# Patient Record
Sex: Female | Born: 1990 | Race: White | Hispanic: No | Marital: Single | Smoking: Former smoker
Health system: Southern US, Community
[De-identification: ages and names within clinical notes are randomized; demographics above are authoritative.]

## PROBLEM LIST (undated history)

## (undated) DIAGNOSIS — G1111 Friedreich ataxia: Secondary | ICD-10-CM

---
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
## 2017-04-13 ENCOUNTER — Other Ambulatory Visit: Payer: Self-pay | Admitting: Cardiology

## 2017-04-13 ENCOUNTER — Encounter: Payer: Self-pay | Admitting: Cardiology

## 2017-04-13 DIAGNOSIS — I43 Cardiomyopathy in diseases classified elsewhere: Secondary | ICD-10-CM

## 2017-04-13 DIAGNOSIS — G1111 Friedreich ataxia: Secondary | ICD-10-CM | POA: Insufficient documentation

## 2017-04-13 DIAGNOSIS — G111 Early-onset cerebellar ataxia: Secondary | ICD-10-CM

## 2017-04-13 NOTE — Progress Notes (Addendum)
   Robin Glenn, Robin Glenn  Date of visit:  04/06/2017 DOB:  1991/03/11    Age:  25 yrs. Medical record number:  [id number]     Account number:  [id number] Primary Care Provider: [facility]  CURRENT DIAGNOSES  1. Dyspnea  2. Early-onset cerebellar ataxia  MEDICATIONS  1. Reclipsen (28) 0.15 mg-0.03 mg tablet, 1 p.o. daily  HISTORY OF PRESENT ILLNESS Patient is seen following an echocardiogram. Her echo shows marked LVH with wall thickness of 18-19 mm. This has been a progression since she was here. I spoke to the patient and her father and told them that I would speak to a physician who dealt with the patient's with [name] and also research things further. After they left I had a chance to do further evaluation and would like her to have a cardiac MRI that we will arrange. At this point with the progression of LVH and EKG changes likely will treat her with beta blocker and also determine if she has any fibrosis in her ventricle. Will await the conversation with the [name] MD. I encouraged her to take the Ibedenone.   PAST HISTORY  Past Medical Illnesses:  Friedreichs Ataxia, scoliosis;  Cardiovascular Illnesses:  no previous history of cardiac disease.;  Surgical Procedures:  no prior surgical procedures;  Cardiology Procedures-Invasive:  no history of prior cardiac procedures;  Cardiology Procedures-Noninvasive:  echocardiogram September 2013, echocardiogram March 2017;  LVEF of 80% documented via echocardiogram on 04/06/2017,    CARDIO-PULMONARY TEST DATES EKG Date:  04/02/2017;  Echocardiography Date: 04/06/2017;     Darden PalmerW. Spencer Tilley, Jr. MD [facility]

## 2017-04-13 NOTE — Progress Notes (Signed)
Robin Glenn, Robin Glenn  Date of visit:  04/02/2017 DOB:  1990-12-26    Age:  26 yrs. Medical record number:  [id number]     Account number:  [id number] Primary Care Provider: [facility] ____________________________ CURRENT DIAGNOSES  1. Dyspnea  2. Early-onset cerebellar ataxia ____________________________ ALLERGIES  NKDA ____________________________ MEDICATIONS  1. Reclipsen (28) 0.15 mg-0.03 mg tablet, 1 p.o. daily ____________________________ CHIEF COMPLAINTS  Followup of Early-onset cerebellar ataxia ____________________________ HISTORY OF PRESENT ILLNESS Patient returns for cardiac followup. She is no longer taking any medications for her Freidreich's ataxia. She works out with a Systems analystpersonal trainer and is thinking about getting a master's degree. She is no longer involved in any of the drug studies that she had previously been involved in. She denies angina and has no PND, orthopnea or edema. She is in a wheelchair and has significant ataxia. Her EKG today shows fairly diffuse T-wave inversions in the fall since her previous EKG of 5 years ago. She has had some insurance changes and did not want to get an echo originally prior to checking about the insurance coverage. ____________________________ PAST HISTORY  Past Medical Illnesses:  Friedreichs Ataxia, scoliosis;  Cardiovascular Illnesses:  no previous history of cardiac disease.;  Surgical Procedures:  no prior surgical procedures;  Cardiology Procedures-Invasive:  no history of prior cardiac procedures;  Cardiology Procedures-Noninvasive:  echocardiogram September 2013, echocardiogram March 2017;  LVEF of 80% documented via echocardiogram on 01/14/2016,   ____________________________ CARDIO-PULMONARY TEST DATES EKG Date:  04/02/2017;  Echocardiography Date: 01/14/2016;   ____________________________ FAMILY HISTORY Father -- Father alive and well Mother -- Mother alive and well ____________________________ SOCIAL  HISTORY Alcohol Use:  no alcohol use;  Smoking:  never smoked;  Diet:  regular diet;  Lifestyle:  single;  Exercise:  works with a Systems analystpersonal trainer 3 days a week;  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 Eyes: wears eye glasses/contact lenses Respiratory: denies dyspnea, cough, wheezing or hemoptysis. Cardiovascular:  please review HPI Abdominal: denies dyspepsia, GI bleeding, constipation, or diarrhea Musculoskeletal:  denies arthritis, venous insufficiency, or muscle weakness. Neurological:  see history of present illness  ____________________________ PHYSICAL EXAMINATION VITAL SIGNS  Blood Pressure:  134/80 Sitting, Right arm, regular cuff   Pulse:  76/min. Weight:  140.00 lbs. Height:  67.00"BMI: 22  Constitutional:  pleasant white female, in no acute distress in wheelchair Skin:  warm and dry to touch, no apparent skin lesions, or masses noted. Head:  normocephalic, normal hair pattern, no masses or tenderness Neck:  supple, without massess. No JVD, thyromegaly or carotid bruits. Carotid upstroke normal. Chest:  normal symmetry, clear to auscultation. Cardiac:  regular rhythm, normal S1 and S2, No S3 or S4, no murmurs, gallops or rubs detected. Extremities & Back:  no deformities, clubbing, cyanosis, erythema or edema observed. Normal muscle strength and tone. Neurological: ataxia of limbs, mild dysrthria ____________________________ IMPRESSIONS/PLAN 1. [name] 2. Evolution of a significantly abnormal EKG with diffuse T-wave inversions 3. 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 I would like for her to have a repeat echo since ECHOs and KGs have previously been done through the drug studies. We discussed getting back to see a  specialist with [name] and considering some sort of treatment including idebenone.  ____________________________ TODAYS ORDERS  1. 2D, color flow, doppler:  At Patient Convenience  2. 2D, color flow, doppler: 1 year  3. 12 Lead EKG: Today  4. 12 Lead EKG: 1 year  5. Return Visit: 1 year                       ____________________________ Cardiology Physician:  Darden Palmer MD [facility]

## 2017-05-02 ENCOUNTER — Ambulatory Visit (HOSPITAL_COMMUNITY)
Admission: RE | Admit: 2017-05-02 | Discharge: 2017-05-02 | Disposition: A | Discharge status: Home / Self Care | Payer: BLUE CROSS/BLUE SHIELD | Source: Ambulatory Visit | Attending: Cardiology | Admitting: Cardiology

## 2017-05-14 ENCOUNTER — Ambulatory Visit (HOSPITAL_COMMUNITY)
Admission: RE | Admit: 2017-05-14 | Discharge: 2017-05-14 | Disposition: A | Discharge status: Home / Self Care | Payer: BLUE CROSS/BLUE SHIELD | Source: Ambulatory Visit | Attending: Cardiology | Admitting: Cardiology

## 2017-05-14 DIAGNOSIS — I517 Cardiomegaly: Secondary | ICD-10-CM | POA: Diagnosis not present

## 2017-05-14 DIAGNOSIS — G111 Early-onset cerebellar ataxia: Secondary | ICD-10-CM | POA: Diagnosis not present

## 2017-05-14 DIAGNOSIS — I43 Cardiomyopathy in diseases classified elsewhere: Secondary | ICD-10-CM | POA: Insufficient documentation

## 2017-05-14 DIAGNOSIS — G1111 Friedreich ataxia: Secondary | ICD-10-CM

## 2017-05-14 LAB — CREATININE, SERUM
Creatinine, Ser: 0.77 mg/dL (ref 0.44–1.00)
GFR calc Af Amer: >60 mL/min (ref >60)
GFR calc non Af Amer: >60 mL/min (ref >60)

## 2017-05-14 MED ORDER — GADOBENATE DIMEGLUMINE 529 MG/ML IV SOLN
23.0000 mL | Freq: Once | INTRAVENOUS | Status: AC | PRN
  Administered 2017-05-14: 23 mL via INTRAVENOUS

## 2018-09-02 DIAGNOSIS — I422 Other hypertrophic cardiomyopathy: Secondary | ICD-10-CM | POA: Insufficient documentation

## 2020-12-14 ENCOUNTER — Emergency Department (HOSPITAL_COMMUNITY)
Admission: EM | Admit: 2020-12-14 | Discharge: 2020-12-14 | Disposition: A | Discharge status: Home / Self Care | Payer: Medicare HMO | Attending: Emergency Medicine | Admitting: Emergency Medicine

## 2020-12-14 ENCOUNTER — Encounter (HOSPITAL_COMMUNITY): Payer: Self-pay

## 2020-12-14 ENCOUNTER — Emergency Department (HOSPITAL_COMMUNITY): Payer: Medicare HMO

## 2020-12-14 ENCOUNTER — Other Ambulatory Visit: Payer: Self-pay

## 2020-12-14 DIAGNOSIS — S99921A Unspecified injury of right foot, initial encounter: Secondary | ICD-10-CM | POA: Diagnosis present

## 2020-12-14 DIAGNOSIS — Y92002 Bathroom of unspecified non-institutional (private) residence single-family (private) house as the place of occurrence of the external cause: Secondary | ICD-10-CM | POA: Insufficient documentation

## 2020-12-14 DIAGNOSIS — S93124A Dislocation of metatarsophalangeal joint of right lesser toe(s), initial encounter: Secondary | ICD-10-CM | POA: Insufficient documentation

## 2020-12-14 DIAGNOSIS — S93104A Unspecified dislocation of right toe(s), initial encounter: Secondary | ICD-10-CM

## 2020-12-14 DIAGNOSIS — W050XXA Fall from non-moving wheelchair, initial encounter: Secondary | ICD-10-CM | POA: Diagnosis not present

## 2020-12-14 DIAGNOSIS — Z87891 Personal history of nicotine dependence: Secondary | ICD-10-CM | POA: Diagnosis not present

## 2020-12-14 HISTORY — DX: Friedreich ataxia: G11.11

## 2020-12-14 MED ORDER — LIDOCAINE HCL (PF) 2 % IJ SOLN
5.0000 mL | Freq: Once | INTRAMUSCULAR | Status: AC
  Administered 2020-12-14: 5 mL

## 2020-12-14 MED ORDER — LIDOCAINE HCL (PF) 2 % IJ SOLN
INTRAMUSCULAR | Status: AC
  Filled 2020-12-14: qty 10

## 2020-12-14 MED ORDER — POVIDONE-IODINE 10 % EX SOLN: CUTANEOUS | Status: DC | PRN

## 2020-12-14 NOTE — Discharge Instructions (Addendum)
Your toe has been reduced (put back into its normal position).  Use the buddy tape as discussed for the next 2 weeks to protect this joint as the ligament heals as discussed.  You may use ice and ibuprofen (motrin) if needed for any pain.  Plan a recheck if your symptoms are not resolved over the next 2 weeks with your primary provider.

## 2020-12-14 NOTE — ED Triage Notes (Signed)
Arrived with mother. Reports she was at home when she lost balance transferring from toilet to wheelchair.  Dx with friedreich's ataxia which attributes to her imbalance.  Reports right pinky toe is what hurts the most. Ankle is tender.

## 2020-12-14 NOTE — ED Notes (Signed)
X Ray at bedside at this time.  

## 2020-12-14 NOTE — ED Notes (Signed)
PA to bedside at this time. 

## 2020-12-15 NOTE — ED Provider Notes (Signed)
[facility] EMERGENCY DEPARTMENT Provider Note   CSN: [id number] Arrival date & time: 12/14/20  5284     History Chief Complaint  Patient presents with  . Foot Injury    Right     Robin Glenn is a 30 y.o. female, wheelchair bound but self transfers due to [name], feel this am transferring from toilet to wheelchair and hit her right 5th toe and lateral foot during the fall.  Has mild soreness along the lateral foot and ankle, but significant pain and deformity of the 5th toe.  She denies any other injury with this fall.  She has had no treatment prior to arrival.   HPI     Past Medical History:  Diagnosis Date  . [name] [facility])     Patient Active Problem List   Diagnosis Date Noted  . Friedreichs ataxia (HCC) 04/13/2017  . 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 No pertinent surgical history.   OB History    Gravida  0   Para  0   Term  0   Preterm  0   AB  0   Living  0     SAB  0   IAB  0   Ectopic  0   Multiple  0   Live Births  0           History reviewed. No pertinent family history.  Social History   Tobacco Use  . Smoking status: Former Smoker    Types: Cigarettes    Quit date: 12/06/2020    Years since quitting: 0.0  . Smokeless tobacco: Never Used  Vaping Use  . Vaping Use: Never used  Substance Use Topics  . Alcohol use: Not Currently  . Drug use: Yes    Frequency: 7.0 times per week    Types: Marijuana    Comment: daily use     Home Medications Prior to Admission medications   Not on File    Allergies    Patient has no allergy information on record.  Review of Systems   Review of Systems  Constitutional: Negative.   Musculoskeletal: Positive for arthralgias. Negative for joint swelling and myalgias.  Neurological: Negative for numbness and headaches.  All other systems reviewed and are negative.   Physical Exam Updated Vital  Signs BP 134/70   Pulse 80   Temp 98.2 F (36.8 C) (Oral)   Resp 17   Ht 5\' 8"  (1.727 m)   Wt 61.2 kg   LMP 11/18/2020 (Within Days)   SpO2 98%   BMI 20.53 kg/m   Physical Exam Vitals reviewed.  Constitutional:      Appearance: She is well-developed and well-nourished.  HENT:     Head: Atraumatic.  Cardiovascular:     Pulses:          Radial pulses are 2+ on the right side and 2+ on the left side.     Comments: Pulses equal bilaterally Musculoskeletal:        General: Tenderness present.     Cervical back: Normal range of motion.     Right foot: Normal capillary refill. Deformity and bony tenderness present. No laceration. Normal pulse.     Comments: Right 5th toe deformity. Distal sensation baseline for pt.   Skin:    General: Skin is warm and dry.  Neurological:     Mental Status: She is alert.     Sensory: No sensory deficit.  Deep Tendon Reflexes: Strength normal. Reflexes normal.  Psychiatric:        Mood and Affect: Mood and affect normal.     ED Results / Procedures / Treatments   Labs (all labs ordered are listed, but only abnormal results are displayed) Labs Reviewed - No data to display  EKG None  Radiology DG Foot 2 Views Right  Result Date: 12/14/2020 CLINICAL DATA:  Fall, right foot pain EXAM: RIGHT FOOT - 2 VIEW COMPARISON:  None. FINDINGS: Two view radiograph right foot demonstrates superolateral dislocation of the right fifth metatarsophalangeal joint. No associated fracture identified. Remaining joint spaces are preserved. Mild soft tissue swelling adjacent to the site of dislocation. IMPRESSION: Right fifth MTP superolateral dislocation. Electronically Signed   By: Helyn Numbers MD   On: 12/14/2020 10:58   DG Toe 5th Right  Result Date: 12/14/2020 CLINICAL DATA:  Post reduction EXAM: RIGHT FIFTH TOE COMPARISON:  12/14/2020 FINDINGS: Reduction of fifth MTP dislocation. Normal alignment and no fracture identified. IMPRESSION: Satisfactory  reduction of the fifth MTP.  No fracture identified. Electronically Signed   By: Marlan Palau M.D.   On: 12/14/2020 12:58    Procedures .Ortho Injury Treatment  Date/Time: 12/14/2020 12:15 PM Performed by: Burgess Amor, PA-C Authorized by: Burgess Amor, PA-C   Consent:    Consent obtained:  Verbal   Consent given by:  Patient   Risks discussed:  Irreducible dislocation and recurrent dislocation   Alternatives discussed:  No treatmentInjury location: toe Location details: right fifth toe Injury type: dislocation Pre-procedure neurovascular assessment: neurovascularly intact Pre-procedure distal perfusion: normal Pre-procedure neurological function comment: baseline for pt, involuntary movements Pre-procedure range of motion: normal Anesthesia: digital block  Anesthesia: Local anesthesia used: yes Local Anesthetic: lidocaine 2% without epinephrine Anesthetic total: 2 mL  Patient sedated: NoManipulation performed: yes Splint type: buddy taping. Splint Applied by: ED Nurse Supplies used: cotton padding and elastic bandage Post-procedure neurovascular assessment: post-procedure neurovascularly intact Post-procedure distal perfusion: normal Comments: Post reduction films obtained and joint reduced.       Medications Ordered in ED Medications  lidocaine HCl (PF) (XYLOCAINE) 2 % injection 5 mL (5 mLs Other Given by Other 12/14/20 1218)    ED Course  I have reviewed the triage vital signs and the nursing notes.  Pertinent labs & imaging results that were available during my care of the patient were reviewed by me and considered in my medical decision making (see chart for details).    MDM Rules/Calculators/A&P                          Right 5th toe dislocation reduction without complication.  Buddy taping, advised to continue taping the toe for the next 2 weeks allowing ligament healing.  Discussed increased risk of repeat dislocation if not supported with splinting. pcp  recheck if sx not completely resolved over the next 2 weeks. Final Clinical Impression(s) / ED Diagnoses Final diagnoses:  Toe dislocation, right, initial encounter    Rx / DC Orders ED Discharge Orders    None       Victoriano Lain 12/15/20 0734    Pollyann Savoy, MD 12/15/20 1524

## 2021-11-07 ENCOUNTER — Emergency Department (HOSPITAL_COMMUNITY)
Admission: EM | Admit: 2021-11-07 | Discharge: 2021-11-07 | Discharge status: Against Medical Advice | Payer: Medicare HMO | Source: Home / Self Care

## 2021-11-07 DIAGNOSIS — J180 Bronchopneumonia, unspecified organism: Secondary | ICD-10-CM | POA: Diagnosis not present

## 2021-11-14 IMAGING — DX DG TOE 5TH 2+V*R*
3 series · 3 of 3 positions shown · non-contrast
Comparison: 12/14/2020

CLINICAL DATA: Post reduction

EXAM:
RIGHT FIFTH TOE

[toe ap]
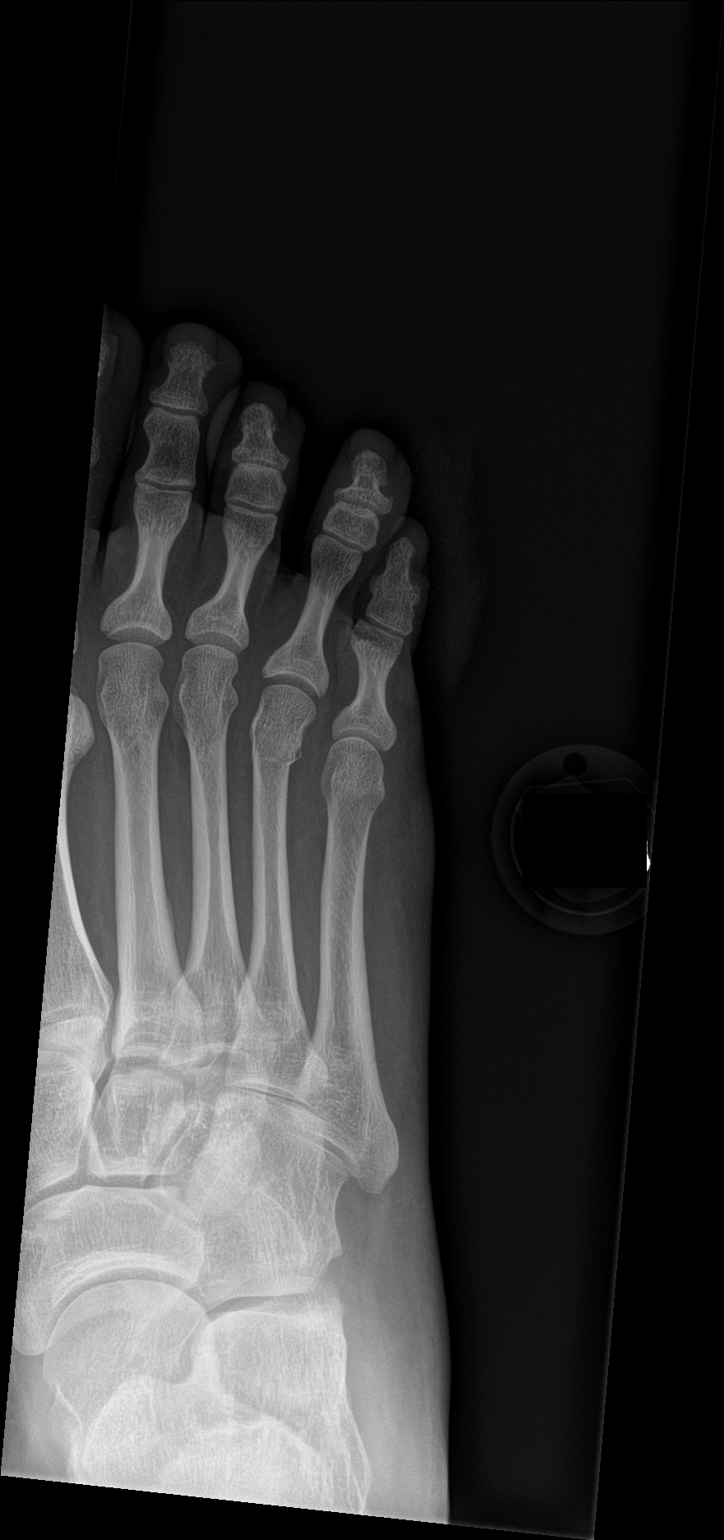

[toe obl]
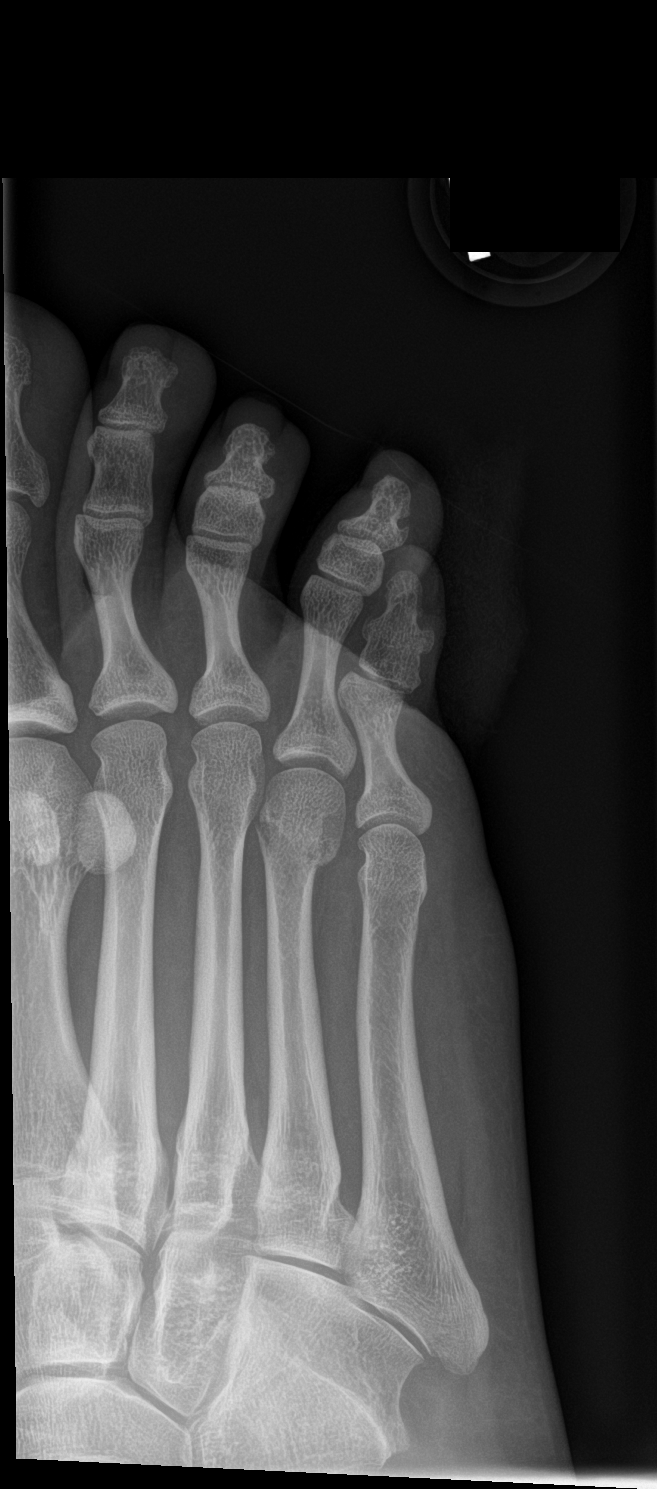

[toe lat]
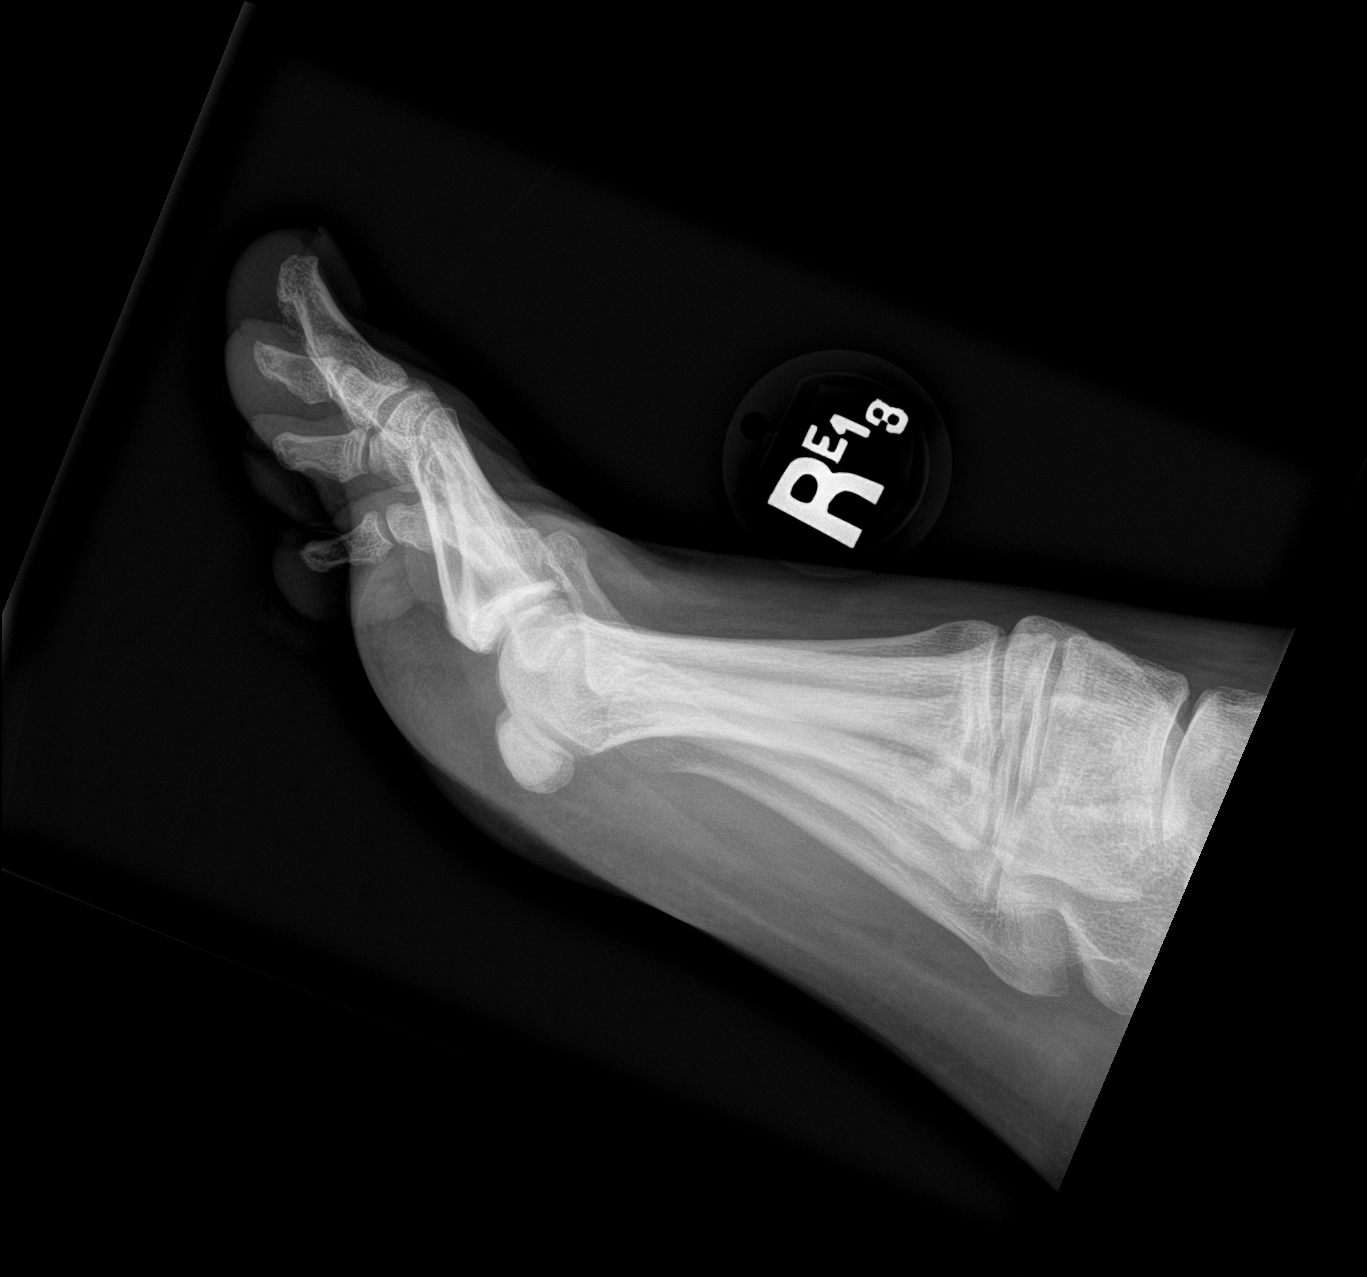

[3 of 3 positions shown; findings below may reference images not displayed]

FINDINGS: Reduction of fifth MTP dislocation. Normal alignment and no fracture
identified.
IMPRESSION: Satisfactory reduction of the fifth MTP.  No fracture identified.

## 2021-12-05 DIAGNOSIS — Z0100 Encounter for examination of eyes and vision without abnormal findings: Secondary | ICD-10-CM | POA: Diagnosis not present

## 2022-02-21 DIAGNOSIS — Z0000 Encounter for general adult medical examination without abnormal findings: Secondary | ICD-10-CM | POA: Diagnosis not present

## 2022-02-21 DIAGNOSIS — Z79899 Other long term (current) drug therapy: Secondary | ICD-10-CM | POA: Diagnosis not present

## 2022-04-24 ENCOUNTER — Telehealth: Payer: Self-pay | Admitting: Neurology

## 2022-04-24 ENCOUNTER — Ambulatory Visit (INDEPENDENT_AMBULATORY_CARE_PROVIDER_SITE_OTHER): Payer: Medicare HMO | Admitting: Neurology

## 2022-04-24 ENCOUNTER — Encounter: Payer: Self-pay | Admitting: Neurology

## 2022-04-24 VITALS — BP 130/79 | HR 74 | Ht 69.0 in

## 2022-04-24 DIAGNOSIS — M419 Scoliosis, unspecified: Secondary | ICD-10-CM | POA: Insufficient documentation

## 2022-04-24 DIAGNOSIS — G1111 Friedreich ataxia: Secondary | ICD-10-CM | POA: Diagnosis not present

## 2022-04-24 NOTE — Progress Notes (Signed)
Chief Complaint  Patient presents with   New Patient (Initial Visit)    Rm 15. Accompanied by mother and father. NP/Paper/[facility]/Kimberly Millsaps NP /Freidreich's ataxia. Discuss new FDA approved medication.      ASSESSMENT AND PLAN  Robin Glenn is a 31 y.o. female  Robin Glenn ataxia  Has been under the care of [place] Dr. Santa Genera, is a candidate for [facility], here to establish care with local neurologist for prescription, paperwork was completed,  She will continue follow-up with Dr. Santa Genera  Laboratory evaluation including required BNP  Went over the medication with patient and her family in detail, including potential side effect, Skycalrys is a enzyme inducer, she is on contraceptives, suggest adding an alternative such as a barrier contraceptive method  Known history of cardiac hypertrophy,  Referral to cardiologist for baseline  Return to clinic in 6 months   DIAGNOSTIC DATA (LABS, IMAGING, TESTING) - I reviewed patient records, labs, notes, testing and imaging myself where available.   MEDICAL HISTORY:  Robin Glenn is a 31 year old female, seen in request by her primary care nurse practitioner [facility] Panda at [facility] for evaluation of Robin Glenn ataxia, she is accompanied by her parents at today's visit April 24, 2022  I reviewed and summarized the referring note. PMHX.  She developed gradual onset of clumsiness, gait abnormality around age 11, progressively worse, by age 32, failed multiple local physicians, including neurologist, family went to [facility], was diagnosed with Robin Glenn ataxia based on genetic testing,  Father brought in note by [facility] physician Dr.  Elise Benne, diagnosis in November 2009, [name], scoliosis, she was referred Robin Glenn ataxia program at [facility] with Dr. Santa Genera is suggested, he has been under Dr. Emilee Hero care since then, was participated in 2  clinical trial from 2012-2013, then 2016-2017, without helping her symptoms,  Her disease continued to progress, increased gait abnormality, she rely on her walker from [age], then went to wheelchair,  She lives at home, work part-time job, is a Chartered loss adjuster, motivation speaker, kept herself busy, denies depression, has boyfriend, on contraceptives, in addition she workout 3 times every week with a Systems analyst since 2013,  Last follow-up with Dr. Santa Genera was before pandemic, MRI cardiac morphology in July 2018 showed normal ejection fraction 62%, marked increase in left ventricular mass, marked fairly asymmetric left ventricular hypertrophy with septum being 22 mm posterior lateral wall 12 mm, moderate left atrial enlargement,  She has significant scoliosis, able to lie flat sleep using 1 pillow, bilateral lower extremity hot sensation, to cool down her leg, denies bowel or bladder incontinence   She has slow worsening dysarthria, mild dysphagia, choking on her food occasionally, needing help cutting her food sometimes, needing help transfer  PHYSICAL EXAM:   Vitals:   04/24/22 0922  BP: 130/79  Pulse: 74  Height: 5\' 9"  (1.753 m)     Body mass index is 19.94 kg/m.  PHYSICAL EXAMNIATION:  Gen: NAD, conversant, well nourised, well groomed                     Cardiovascular: Regular rate rhythm, no peripheral edema, warm, nontender. Eyes: Conjunctivae clear without exudates or hemorrhage Neck: Supple, no carotid bruits. Pulmonary: Clear to auscultation bilaterally  Skeleton: Significant scoliosis  NEUROLOGICAL EXAM:  MENTAL STATUS: Speech/cognition: Awake, alert, oriented to history taking and casual conversation, dysarthria CRANIAL NERVES: CN II: Visual fields are full to confrontation. Pupils are round equal and  briskly reactive to light. CN III, IV, VI: extraocular movement are normal. No ptosis. CN V: Facial sensation is intact to light touch CN VII: Face is  symmetric with normal eye closure  CN VIII: Hearing is normal to causal conversation. CN IX, X: Phonation is normal. CN XI: Head turning and shoulder shrug are intact  MOTOR: Bilateral upper and lower extremity proximal and distal muscle strengths 4 out of 5,  REFLEXES: Areflexia  SENSORY: Intact to light touch, vibratory sensation and pinprick  COORDINATION: No significant truncal ataxia, significant bilateral upper and lower extremity dysmetria left worse than right  GAIT/STANCE: Deferred  REVIEW OF SYSTEMS:  Full 14 system review of systems performed and notable only for as above All other review of systems were negative.   ALLERGIES: No Known Allergies  HOME MEDICATIONS: Current Outpatient Medications  Medication Sig Dispense Refill   TRI-ESTARYLLA 0.18/0.215/0.25 MG-35 MCG tablet Take 1 tablet by mouth daily.     No current facility-administered medications for this visit.    PAST MEDICAL HISTORY: Past Medical History:  Diagnosis Date   [name] (HCC)     PAST SURGICAL HISTORY: History reviewed. No pertinent surgical history.  FAMILY HISTORY: History reviewed. No pertinent family history.  SOCIAL HISTORY: Social History   Socioeconomic History   Marital status: Single    Spouse name: Not on file   Number of children: Not on file   Years of education: Not on file   Highest education level: Not on file  Occupational History   Not on file  Tobacco Use   Smoking status: Former    Types: Cigarettes    Quit date: 12/06/2020    Years since quitting: 1.3   Smokeless tobacco: Never  Vaping Use   Vaping Use: Never used  Substance and Sexual Activity   Alcohol use: Not Currently   Drug use: Yes    Frequency: 7.0 times per week    Types: Marijuana    Comment: daily use    Sexual activity: Not Currently    Birth control/protection: Pill  Other Topics Concern   Not on file  Social History Narrative   Not on file   Social Determinants of  Health   Financial Resource Strain: Not on file  Food Insecurity: Not on file  Transportation Needs: Not on file  Physical Activity: Not on file  Stress: Not on file  Social Connections: Not on file  Intimate Partner Violence: Not on file    Total time spent reviewing the chart, obtaining history, examined patient, ordering tests, documentation, consultations and family, care coordination was 76 minutes.      Levert Feinstein, M.D. Ph.D.  [facility] [address], [address] [place], [place] [address] Ph: [phone] Fax: [phone]  CC:  Marva Panda, NP [address] [place],  [place] [address]  Marva Panda, NP  ,

## 2022-04-24 NOTE — Patient Instructions (Signed)
[email or web address]   Advise patients to take [facility] on an empty stomach at least 1 hour before eating [see Dosage  and Administration (2.2)]. Swallow SKYCLARYS capsules whole. Do not open, crush, or chew. Advise patients that if a dose of SKYCLARYS is missed to not to double their dose or take more than  the prescribed dose [see Dosage and Administration (2.3)].  Advise patients to avoid grapefruit juice and grapefruit while they are taking [facility] [see Drug  Interactions (7.1)].  Hormonal Contraceptives Omaveloxolone is a weak CYP3A4 inducer [see Clinical Pharmacology (12.3)]. Concomitant use with SKYCLARYS may reduce the efficacy of hormonal contraceptives. Advise patients to avoid  concomitant use with combined hormonal contraceptives (e.g., pill, patch, ring), implants, and  progestin only pills [see Use in Specific Populations (8.3)].   8.3 Females and Males of Reproductive Potential  SKYCLARYS may decrease the efficacy of hormonal contraceptives [see Drug Interactions (7.2) and  Clinical Pharmacology (12.3)]. Advise patients to avoid concomitant use with combined hormonal  contraceptives (e.g., pill, patch, ring), implants, and progestin only pills. Counsel females using  hormonal contraceptives to use an alternative contraceptive method (e.g., non-hormonal intrauterine  system) or additional non-hormonal contraceptive (e.g., condoms) during concomitant use and for 28  days after discontinuation of SKYCLARYS.

## 2022-04-24 NOTE — Telephone Encounter (Signed)
Referral for Cardiology sent to [facility] [phone].

## 2022-04-25 ENCOUNTER — Other Ambulatory Visit: Payer: Self-pay | Admitting: *Deleted

## 2022-04-25 ENCOUNTER — Telehealth: Payer: Self-pay | Admitting: *Deleted

## 2022-04-25 LAB — TSH: TSH: 0.879 u[IU]/mL (ref 0.450–4.500)

## 2022-04-25 LAB — BRAIN NATRIURETIC PEPTIDE: BNP: 77.5 pg/mL (ref 0.0–100.0)

## 2022-04-25 MED ORDER — SKYCLARYS 50 MG PO CAPS: 150.0000 mg | ORAL_CAPSULE | Freq: Every day | ORAL | 11 refills | Status: DC

## 2022-04-25 NOTE — Telephone Encounter (Addendum)
Start form faxed to the [facility] program for [facility] 50mg , 3 capsules daily.  Ph: [phone] Fax: [phone]  PA started on covermymeds (key: [id number]). Pharmacy coverage through [employer] [phone]). PA approved through 11/12/2022.  ____________________________________ I called the REACH program and spoke to [facility] who works as a [employer]. States once the company completes a benefits investigation then they will reach out to the family. They will discuss co-pay and shipment.   I also called the patient's mother to provide her with this update. If she does not hear from the company, she was instructed to let Sports coach know so we can assist.

## 2022-04-26 ENCOUNTER — Telehealth: Payer: Self-pay | Admitting: *Deleted

## 2022-04-26 NOTE — Telephone Encounter (Signed)
-----   Message from Levert Feinstein, MD sent at 04/25/2022  5:27 PM EDT ----- Please call patient, laboratory evaluation showed no significant abnormalities.

## 2022-04-26 NOTE — Telephone Encounter (Signed)
Tried mothers phone# [phone] twice. Got busy signal.  Called mother at [phone]. Relayed results per Dr. Rhea Belton note. She verbalized understanding.

## 2022-05-10 ENCOUNTER — Ambulatory Visit (INDEPENDENT_AMBULATORY_CARE_PROVIDER_SITE_OTHER): Payer: Medicare HMO

## 2022-05-10 ENCOUNTER — Ambulatory Visit: Payer: Medicare HMO | Admitting: Podiatry

## 2022-05-10 DIAGNOSIS — R2689 Other abnormalities of gait and mobility: Secondary | ICD-10-CM

## 2022-05-10 DIAGNOSIS — M25372 Other instability, left ankle: Secondary | ICD-10-CM

## 2022-05-10 DIAGNOSIS — G1111 Friedreich ataxia: Secondary | ICD-10-CM

## 2022-05-17 NOTE — Progress Notes (Signed)
  Subjective:  Patient ID: Robin Glenn, female    DOB: [birth year],  MRN: [id number]  Chief Complaint  Patient presents with   Foot Pain    31 y.o. female presents with the above complaint.  Patient presents with complaint of left ankle instability with some pain and swelling.  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  She states that she has been diagnosed with this for a long time and slowly has decompensated with needing for wheelchair with some assisted walking.  Patient states that she twisted her ankle about 2 to 3 weeks ago and there is some swelling and pain associated with it mildly.  She wanted get it evaluated she wants to know why her left ankle is turned inwards and not the right ankle.  She states is making it very difficult to ambulate even limited.  She does not wear any kind of bracing she has not seen anyone else prior to seeing me.  She wanted to get it evaluated.   Review of Systems: Negative except as noted in the HPI. Denies N/V/F/Ch.  Past Medical History:  Diagnosis Date   [name] (HCC)     Current Outpatient Medications:    Omaveloxolone (SKYCLARYS) 50 MG CAPS, Take 150 mg by mouth daily., Disp: 90 capsule, Rfl: 11   TRI-ESTARYLLA 0.18/0.215/0.25 MG-35 MCG tablet, Take 1 tablet by mouth daily., Disp: , Rfl:   Social History   Tobacco Use  Smoking Status Former   Types: Cigarettes   Quit date: 12/06/2020   Years since quitting: 1.4  Smokeless Tobacco Never    No Known Allergies Objective:  There were no vitals filed for this visit. There is no height or weight on file to calculate BMI. Constitutional Well developed. Well nourished.  Vascular Dorsalis pedis pulses palpable bilaterally. Posterior tibial pulses palpable bilaterally. Capillary refill normal to all digits.  No cyanosis or clubbing noted. Pedal hair growth normal.  Neurologic Normal speech. Oriented to person, place, and time. Epicritic  sensation to light touch grossly present bilaterally.  Dermatologic Nails well groomed and normal in appearance. No open wounds. No skin lesions.  Orthopedic: Gait examination shows with limitation weakening of the peroneal tendon with inversion and plantarflexion of the foot.  Patient has consistent finding with weakness of the anterior muscle as well consistent with dropfoot.   Radiographs: 3 views of skeletally mature adult left ankle: Limitations noted due to patient is unable to take proper views however no signs of fractures noted.  No ankle fracture noted.  No bony abnormalities identified Assessment:   1. Friedreichs ataxia (HCC)   2. Antalgic gait   3. Ankle instability, left    Plan:  Patient was evaluated and treated and all questions answered.  Left ankle instability with antalgic gait with underlying history of Fredericks ataxia -All questions and concerns were discussed with the patient in extensive detail I ultimately discussed with her that she will benefit from bracing while ambulation.  She does states that Tri-Lock ankle brace does give her some stability while ambulating.  I believe she needs more of a permanent bracing for this.  I will place her in a cam boot to give her more stability to allow her to ambulate.  This will also help with the swelling and pain from the ankle sprain that she may have experienced. -I have given her prescription for [employer] for possible dropfoot brace/AFO bracing -Cam boot was dispensed  No follow-ups on file.

## 2022-05-17 NOTE — Telephone Encounter (Addendum)
Received [employer] application for the patient assistance program. Completed provider sections and faxed back to company. This was sent along with her clinical notes, demographics, copy of insurance cards and prescription form signed by MD.  [employer] Program:  Ph: [phone] Fax: [phone]

## 2022-05-22 NOTE — Telephone Encounter (Signed)
[employer]) have not received the Patient Assistant Application. Request to refax the form.  Would like a call from the nurse Contact info: [phone]

## 2022-05-22 NOTE — Telephone Encounter (Signed)
We received a confirmation of receipt on 05/17/22. I have re-faxed the patient assistance application again today. I also called [employer] back so she can be watching out for it. She will call back, if anything further is needed.

## 2022-05-26 ENCOUNTER — Ambulatory Visit (HOSPITAL_BASED_OUTPATIENT_CLINIC_OR_DEPARTMENT_OTHER): Payer: Medicare HMO | Admitting: Cardiology

## 2022-05-26 ENCOUNTER — Encounter (HOSPITAL_BASED_OUTPATIENT_CLINIC_OR_DEPARTMENT_OTHER): Payer: Self-pay | Admitting: Cardiology

## 2022-05-26 VITALS — BP 125/75 | HR 82 | Wt 150.0 lb

## 2022-05-26 DIAGNOSIS — G1111 Friedreich ataxia: Secondary | ICD-10-CM

## 2022-05-26 DIAGNOSIS — Z8249 Family history of ischemic heart disease and other diseases of the circulatory system: Secondary | ICD-10-CM

## 2022-05-26 DIAGNOSIS — R9431 Abnormal electrocardiogram [ECG] [EKG]: Secondary | ICD-10-CM

## 2022-05-26 DIAGNOSIS — I43 Cardiomyopathy in diseases classified elsewhere: Secondary | ICD-10-CM

## 2022-05-26 NOTE — Patient Instructions (Addendum)
Medication Instructions:  °Your Physician recommend you continue on your current medication as directed.   ° °*If you need a refill on your cardiac medications before your next appointment, please call your pharmacy* ° ° °Lab Work: °None ordered today ° ° °Testing/Procedures: °Your physician has requested that you have an echocardiogram. Echocardiography is a painless test that uses sound waves to create images of your heart. It provides your doctor with information about the size and shape of your heart and how well your heart’s chambers and valves are working. This procedure takes approximately one hour. There are no restrictions for this procedure. °[address] ° ° ° °Follow-Up: °At [facility], you and your health needs are our priority.  As part of our continuing mission to provide you with exceptional heart care, we have created designated Provider Care Teams.  These Care Teams include your primary Cardiologist (physician) and Advanced Practice Providers (APPs -  Physician Assistants and Nurse Practitioners) who all work together to provide you with the care you need, when you need it. ° °We recommend signing up for the patient portal called "MyChart".  Sign up information is provided on this After Visit Summary.  MyChart is used to connect with patients for Virtual Visits (Telemedicine).  Patients are able to view lab/test results, encounter notes, upcoming appointments, etc.  Non-urgent messages can be sent to your provider as well.   °To learn more about what you can do with MyChart, go to [email or web address].   ° °Your next appointment:   °1 year(s) ° °The format for your next appointment:   °In Person ° °Provider:   °Bridgette Christopher, MD  ° ° °

## 2022-05-26 NOTE — Progress Notes (Signed)
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 Previously followed by Dr. Donnie Aho.  Was on interferon-gamma therapy for a year, there is some evidence that this can be related to LVH. Was on this 2016-2017.  About to start omaveloxolone (skyclarys) within the next month.   She has a physical therapist, tries to remain active at the gym. Recently injured her left ankle, in a boot today. Enjoys being outside, being pushed in the wheelchair at the park. Enjoys using the exercise bike as it is table for her ankles. Does a lot of machines with her trainer at the gym, likes the leg machines to keep them strong.  Denies chest pain, shortness of breath at rest or with normal exertion. No PND, orthopnea, significant LE edema or unexpected weight gain. No syncope or palpitations.   Past Medical History:  Diagnosis Date   [name] (HCC)     No past surgical history on file.  Current Medications: Current Outpatient Medications on File Prior to Visit  Medication Sig   Omaveloxolone (SKYCLARYS) 50 MG CAPS Take 150 mg by mouth daily.   TRI-ESTARYLLA 0.18/0.215/0.25 MG-35 MCG tablet Take 1 tablet by mouth daily.   No current facility-administered medications on file prior to visit.     Allergies:   Patient has no known allergies.   Social History   Tobacco Use   Smoking status: Former    Types: Cigarettes    Quit date: 12/06/2020    Years since quitting: 1.4   Smokeless  tobacco: Never  Vaping Use   Vaping Use: Never used  Substance Use Topics   Alcohol use: Not Currently   Drug use: Yes    Frequency: 7.0 times per week    Types: Marijuana    Comment: daily use     Family History: Father is my patient, history of HOCM and CAD.  ROS:   Please see the history of present illness.  Additional pertinent ROS: Constitutional: Negative for chills, fever, night sweats, unintentional weight loss  HENT: Negative for ear pain and hearing loss.   Eyes: Negative for loss of vision and eye pain.  Respiratory: Negative for cough, sputum, wheezing.   Cardiovascular: See HPI. Gastrointestinal: Negative for abdominal pain, melena, and hematochezia.  Genitourinary: Negative for dysuria and hematuria.  Musculoskeletal: Negative for falls and myalgias.  Skin: Negative for itching and rash.  Neurological: Negative for focal weakness, focal sensory changes and loss of consciousness.  Endo/Heme/Allergies: Does not bruise/bleed easily.     EKGs/Labs/Other Studies Reviewed:    The following studies were reviewed today: Cardiac MRI 05/14/2017 FINDINGS: There was moderate LAE. The RA/RV were normal in size and function. There was no ASD/PFO/VSD. There was no pericardial effusion. The mitral aortic and tricuspid valves appeared normal. There was severe fairly asymmetric septal hypertrophy. The septum measured 22 mm with the posterior and lateral walls measuring 12 mm. There was no SAM. There was hyperdynamic LV systolic function with accelerated flow velocity  through the mid myocardium. The quantitative EF was 62% (EDV 92 cc ESV 35 cc SV 57 cc). LV mass was markedly elevated at 191 grams and 109 grams/m2. Delayed enhancement images with gadolinium showed no significant uptake/scar. (There was one very small area near the basal septum that I felt was partial volume averaging in the RV cavity).   IMPRESSION: 1) Normal LV function quantitative EF 62%   2) Marked  increase in LV mass 191 grams / 109 grams/m2   3) Marked fairly asymmetric LV hypertrophy with septum being 22 mm and posterior ans lateral walls 12 mm   4) No delayed LGE uptake on inversion recovery sequences   5) Moderate LAE  EKG:  EKG is personally reviewed.   05/26/22: NSR at 82 bpm, LAE, LVH with repol  Recent Labs: 04/24/2022: BNP 77.5; TSH 0.879  Recent Lipid Panel No results found for: "CHOL", "TRIG", "HDL", "CHOLHDL", "VLDL", "LDLCALC", "LDLDIRECT"  Physical Exam:    VS:  BP 125/75   Pulse 82   Wt 150 lb (68 kg)   SpO2 99%   BMI 22.15 kg/m     Wt Readings from Last 3 Encounters:  05/26/22 150 lb (68 kg)  12/14/20 135 lb (61.2 kg)    GEN: Well nourished, well developed in no acute distress HEENT: Normal, moist mucous membranes NECK: No JVD CARDIAC: regular rhythm, normal S1 and S2, no rubs or gallops. No murmur. VASCULAR: Radial and DP pulses 2+ bilaterally. No carotid bruits RESPIRATORY:  Clear to auscultation without rales, wheezing or rhonchi  ABDOMEN: Soft, non-tender, non-distended MUSCULOSKELETAL:  in wheelchair today, moves all 4 limbs independently SKIN: Warm and dry, no edema NEUROLOGIC:  Alert and oriented x 3. No focal neuro deficits noted. PSYCHIATRIC:  Normal affect    ASSESSMENT:    1. 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 Friedreichs ataxia (HCC)   3. Abnormal ECG   4. 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 UPDATED TO ADD: moderate to severe concentric LVH (walls 1.7 cm), EF normal but stroke volume 57 ml. Chordal SAM and mid cavitary gradient 17 mmHg -wants to work on continued exercise/strengthening. Interested in [facility], referred today  Family history of CAD, HOCM -father is also my patient -Friedreichs ataxia associated with more of a septal asymmetric hypertrophy. She has concentric LVH,  more consistent with her family history of HCM, but cannot exclude involvement from FA -no documented arrhythmias, no syncope  Cardiac risk counseling and prevention recommendations: -recommend heart healthy/Mediterranean diet, with whole grains, fruits, vegetable, fish, lean meats, nuts, and olive oil. Limit salt. -recommend moderate walking, 3-5 times/week for 30-50 minutes each session. Aim for at least 150 minutes.week. Goal should be pace of 3 miles/hours, or walking 1.5 miles in 30 minutes -recommend avoidance of tobacco products. Avoid excess alcohol. -ASCVD risk score: The ASCVD Risk score (Arnett DK, et al., 2019) failed to calculate for the following reasons:   The 2019 ASCVD risk score is only valid for ages 76 to 41    Plan for follow up:  Jodelle Red, MD, PhD, [facility] [place]  [facility]    Medication Adjustments/Labs and Tests Ordered: Current medicines are reviewed at length with the patient today.  Concerns regarding medicines are outlined above.  Orders Placed This Encounter  Procedures   Ambulatory referral to [facility]   EKG 12-Lead  ECHOCARDIOGRAM COMPLETE   No orders of the defined types were placed in this encounter.   Patient Instructions  Medication Instructions:  Your Physician recommend you continue on your current medication as directed.    *If you need a refill on your cardiac medications before your next appointment, please call your pharmacy*   Lab Work: None ordered today   Testing/Procedures: Your physician has requested that you have an echocardiogram. Echocardiography is a painless test that uses sound waves to create images of your heart. It provides your doctor with information about the size and shape of your heart and how well your heart's chambers and valves are working. This procedure takes approximately one hour. There are no restrictions for this procedure. [facility], you and your health needs are our priority.  As part of our continuing mission to provide you with exceptional heart care, we have created designated Provider Care Teams.  These Care Teams include your primary Cardiologist (physician) and Advanced Practice Providers (APPs -  Physician Assistants and Nurse Practitioners) who all work together to provide you with the care you need, when you need it.  We recommend signing up for the patient portal called "MyChart".  Sign up information is provided on this After Visit Summary.  MyChart is used to connect with patients for Virtual Visits (Telemedicine).  Patients are able to view lab/test results, encounter notes, upcoming appointments, etc.  Non-urgent messages can be sent to your provider as well.   To learn more about what you can do with MyChart, go to [email or web address].    Your next appointment:   1 year(s)  The format for your next appointment:   In Person  Provider:   Buford Dresser, MD{         Signed, Buford Dresser, MD PhD 05/26/2022     [place]

## 2022-06-01 NOTE — Telephone Encounter (Signed)
Shayleen from [employer] has called re: them not received the Pt Assistance Program form ph# to call is the [phone] fax#[phone] attention [employer]

## 2022-06-01 NOTE — Telephone Encounter (Signed)
I spoke to [facility]. She needs two additional pages for patient assistance. Completed, signed and faxed back.

## 2022-06-07 NOTE — Telephone Encounter (Signed)
Received patient assistance program approval notice for [employer]. Pt ID w/ PAP: [id number].  The patient's prescription will be filled through the [employer] program [phone]). They use the following pharmacy:  [facility] Ph: [phone] Fax: [phone]

## 2022-06-09 DIAGNOSIS — G119 Hereditary ataxia, unspecified: Secondary | ICD-10-CM | POA: Diagnosis not present

## 2022-06-09 DIAGNOSIS — Z309 Encounter for contraceptive management, unspecified: Secondary | ICD-10-CM | POA: Diagnosis not present

## 2022-06-09 DIAGNOSIS — I422 Other hypertrophic cardiomyopathy: Secondary | ICD-10-CM | POA: Diagnosis not present

## 2022-06-12 ENCOUNTER — Ambulatory Visit (INDEPENDENT_AMBULATORY_CARE_PROVIDER_SITE_OTHER): Payer: Medicare HMO

## 2022-06-12 DIAGNOSIS — R9431 Abnormal electrocardiogram [ECG] [EKG]: Secondary | ICD-10-CM | POA: Diagnosis not present

## 2022-06-12 DIAGNOSIS — G1111 Friedreich ataxia: Secondary | ICD-10-CM

## 2022-06-12 DIAGNOSIS — I43 Cardiomyopathy in diseases classified elsewhere: Secondary | ICD-10-CM | POA: Diagnosis not present

## 2022-06-12 LAB — ECHOCARDIOGRAM COMPLETE
AR max vel: 2.36 cm2
AV Area VTI: 2.26 cm2
AV Area mean vel: 2.21 cm2
AV Mean grad: 4 mmHg
AV Peak grad: 6.9 mmHg
Ao pk vel: 1.31 m/s
Area-P 1/2: 5.66 cm2
S' Lateral: 1.83 cm

## 2022-06-19 DIAGNOSIS — M25372 Other instability, left ankle: Secondary | ICD-10-CM | POA: Diagnosis not present

## 2022-06-21 ENCOUNTER — Ambulatory Visit: Payer: Medicare HMO | Admitting: Podiatry

## 2022-06-21 DIAGNOSIS — R2689 Other abnormalities of gait and mobility: Secondary | ICD-10-CM | POA: Diagnosis not present

## 2022-06-21 DIAGNOSIS — M25372 Other instability, left ankle: Secondary | ICD-10-CM | POA: Diagnosis not present

## 2022-06-21 DIAGNOSIS — G1111 Friedreich ataxia: Secondary | ICD-10-CM

## 2022-06-21 NOTE — Progress Notes (Signed)
  Subjective:  Patient ID: Robin Glenn, female    DOB: Jul 15, 1991,  MRN: [id number]  Chief Complaint  Patient presents with   Foot Pain    31 y.o. female presents with the above complaint.  Patient presents with follow-up to left ankle instability with a history of Fredericks ataxia.  She states the bracing helped considerably.  The ankle brace has also helped.  She denies any other acute complaints.   Review of Systems: Negative except as noted in the HPI. Denies N/V/F/Ch.  Past Medical History:  Diagnosis Date   [name] (HCC)     Current Outpatient Medications:    Omaveloxolone (SKYCLARYS) 50 MG CAPS, Take 150 mg by mouth daily., Disp: 90 capsule, Rfl: 11   TRI-ESTARYLLA 0.18/0.215/0.25 MG-35 MCG tablet, Take 1 tablet by mouth daily., Disp: , Rfl:   Social History   Tobacco Use  Smoking Status Former   Types: Cigarettes   Quit date: 12/06/2020   Years since quitting: 1.5  Smokeless Tobacco Never    No Known Allergies Objective:  There were no vitals filed for this visit. There is no height or weight on file to calculate BMI. Constitutional Well developed. Well nourished.  Vascular Dorsalis pedis pulses palpable bilaterally. Posterior tibial pulses palpable bilaterally. Capillary refill normal to all digits.  No cyanosis or clubbing noted. Pedal hair growth normal.  Neurologic Normal speech. Oriented to person, place, and time. Epicritic sensation to light touch grossly present bilaterally.  Dermatologic Nails well groomed and normal in appearance. No open wounds. No skin lesions.  Orthopedic: Gait examination shows with limitation weakening of the peroneal tendon with inversion and plantarflexion of the foot.  Patient has consistent finding with weakness of the anterior muscle as well consistent with dropfoot.   Radiographs: 3 views of skeletally mature adult left ankle: Limitations noted due to patient is unable to take proper views however no signs of  fractures noted.  No ankle fracture noted.  No bony abnormalities identified Assessment:   1. Friedreichs ataxia (HCC)   2. Antalgic gait   3. Ankle instability, left     Plan:  Patient was evaluated and treated and all questions answered.  Left ankle instability with antalgic gait with underlying history of Fredericks ataxia -All questions and concerns were discussed with the patient in extensive detail  -Plan clear instability has resolved with bracing.  At this time she may have to wear brace for the rest of her life.  She states understanding. -The brace is not Tri-Lock ankle braces given her stability while trying to get up.  At this time I discussed with her that if it worsens with the brace starts wearing off come back and see me.  She states understanding. -Disc to do cam boot.  No follow-ups on file.

## 2022-08-25 ENCOUNTER — Encounter: Payer: Self-pay | Admitting: Neurology

## 2022-08-28 ENCOUNTER — Other Ambulatory Visit: Payer: Self-pay | Admitting: Neurology

## 2022-08-28 DIAGNOSIS — G1111 Friedreich ataxia: Secondary | ICD-10-CM

## 2022-08-30 ENCOUNTER — Other Ambulatory Visit (INDEPENDENT_AMBULATORY_CARE_PROVIDER_SITE_OTHER): Payer: Self-pay

## 2022-08-30 DIAGNOSIS — Z0289 Encounter for other administrative examinations: Secondary | ICD-10-CM

## 2022-08-30 DIAGNOSIS — G1111 Friedreich ataxia: Secondary | ICD-10-CM | POA: Diagnosis not present

## 2022-08-31 LAB — CBC WITH DIFFERENTIAL/PLATELET
Basophils Absolute: 0.1 10*3/uL (ref 0.0–0.2)
Basos: 1 %
EOS (ABSOLUTE): 0.1 10*3/uL (ref 0.0–0.4)
Eos: 1 %
Hematocrit: 40 % (ref 34.0–46.6)
Hemoglobin: 13 g/dL (ref 11.1–15.9)
Immature Grans (Abs): 0.1 10*3/uL (ref 0.0–0.1)
Immature Granulocytes: 1 %
Lymphocytes Absolute: 1.7 10*3/uL (ref 0.7–3.1)
Lymphs: 25 %
MCH: 30.2 pg (ref 26.6–33.0)
MCHC: 32.5 g/dL (ref 31.5–35.7)
MCV: 93 fL (ref 79–97)
Monocytes Absolute: 0.6 10*3/uL (ref 0.1–0.9)
Monocytes: 9 %
Neutrophils Absolute: 4.4 10*3/uL (ref 1.4–7.0)
Neutrophils: 63 %
Platelets: 241 10*3/uL (ref 150–450)
RBC: 4.31 x10E6/uL (ref 3.77–5.28)
RDW: 12.2 % (ref 11.7–15.4)
WBC: 7 10*3/uL (ref 3.4–10.8)

## 2022-08-31 LAB — COMPREHENSIVE METABOLIC PANEL
ALT: 37 IU/L — ABNORMAL HIGH (ref 0–32)
AST: 29 IU/L (ref 0–40)
Albumin/Globulin Ratio: 1.8 (ref 1.2–2.2)
Albumin: 4.1 g/dL (ref 4.0–5.0)
Alkaline Phosphatase: 36 IU/L — ABNORMAL LOW (ref 44–121)
BUN/Creatinine Ratio: 32 — ABNORMAL HIGH (ref 9–23)
BUN: 19 mg/dL (ref 6–20)
Bilirubin Total: 0.5 mg/dL (ref 0.0–1.2)
CO2: 24 mmol/L (ref 20–29)
Calcium: 9.1 mg/dL (ref 8.7–10.2)
Chloride: 104 mmol/L (ref 96–106)
Creatinine, Ser: 0.59 mg/dL (ref 0.57–1.00)
Globulin, Total: 2.3 g/dL (ref 1.5–4.5)
Glucose: 81 mg/dL (ref 70–99)
Potassium: 4.4 mmol/L (ref 3.5–5.2)
Sodium: 142 mmol/L (ref 134–144)
Total Protein: 6.4 g/dL (ref 6.0–8.5)
eGFR: 124 mL/min/{1.73_m2} (ref >59)

## 2022-09-14 ENCOUNTER — Encounter: Payer: Self-pay | Admitting: Neurology

## 2022-09-14 ENCOUNTER — Encounter: Payer: Self-pay | Admitting: Podiatry

## 2022-09-14 NOTE — Telephone Encounter (Signed)
Please advise 

## 2022-09-19 ENCOUNTER — Other Ambulatory Visit: Payer: Self-pay | Admitting: Podiatry

## 2022-09-19 DIAGNOSIS — M25372 Other instability, left ankle: Secondary | ICD-10-CM

## 2022-09-21 ENCOUNTER — Encounter: Payer: Self-pay | Admitting: Neurology

## 2022-09-21 ENCOUNTER — Ambulatory Visit: Payer: Medicare HMO | Admitting: Neurology

## 2022-09-21 VITALS — BP 118/76 | HR 80 | Ht 69.0 in

## 2022-09-21 DIAGNOSIS — G1111 Friedreich ataxia: Secondary | ICD-10-CM | POA: Diagnosis not present

## 2022-09-21 DIAGNOSIS — G8929 Other chronic pain: Secondary | ICD-10-CM

## 2022-09-21 DIAGNOSIS — M25572 Pain in left ankle and joints of left foot: Secondary | ICD-10-CM | POA: Diagnosis not present

## 2022-09-21 NOTE — Progress Notes (Signed)
Chief Complaint  Patient presents with   Follow-up    Rm 14.  Accompanied by mom and dad. C/o previous left ankle injury. Has left foot inversion. Discuss botox for left calve. C/o balance difficulties.      ASSESSMENT AND PLAN  Robin Glenn is a 31 y.o. female  Robin Glenn ataxia  Has been under the care of [place] specialist Dr. Santa Glenn, started [facility] since August 2023, tolerating it well  She will continue follow-up with Dr. Santa Glenn  Laboratory evaluation including BNP today   Known history of cardiac hypertrophy,  Echocardiogram showed hypertrophic cardiomyopathy, normal ejection fraction, but significant concentric hypertrophy Worsening left ankle pain,  The tendency for left ankle plantarflexion are most consistent with musculoskeletal etiology, suggested warm compression, massage, not a good candidate for botulism toxin injection  Highly recommend water aerobic  Return To Clinic With NP In 6 Months  DIAGNOSTIC DATA (LABS, IMAGING, TESTING) - I reviewed patient records, labs, notes, testing and imaging myself where available.   MEDICAL HISTORY:  Robin Glenn is a 31 year old female, seen in request by her primary care nurse practitioner [facility] at [facility] for evaluation of Robin Glenn ataxia, she is accompanied by her parents at today's visit April 24, 2022  I reviewed and summarized the referring note. PMHX.  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  Elise Glenn, diagnosis in November 2009, [name], scoliosis, she was referred Robin Glenn ataxia program at [facility] with Dr. Santa Glenn is suggested, he has been under Dr. Emilee Glenn care since  then, was participated in 2 clinical trial from 2012-2013, then 2016-2017, without helping her symptoms,  Her disease continued to progress, increased gait abnormality, she rely on her walker from [age], then went to wheelchair,  She lives at home, work part-time job, is a Chartered loss adjuster, motivation speaker, kept herself busy, denies depression, has boyfriend, on contraceptives, in addition she workout 3 times every week with a Systems analyst since 2013,  Last follow-up with Dr. Santa Glenn was before pandemic, MRI cardiac morphology in July 2018 showed normal ejection fraction 62%, marked increase in left ventricular mass, marked fairly asymmetric left ventricular hypertrophy with septum being 22 mm posterior lateral wall 12 mm, moderate left atrial enlargement,  She has significant scoliosis, able to lie flat sleep using 1 pillow, bilateral lower extremity hot sensation, to cool down her leg, denies bowel or bladder incontinence   She has slow worsening dysarthria, mild dysphagia, choking on her food occasionally, needing help cutting her food sometimes, needing help transfer   Update September 21, 2022: She start Skycaryls 150mg  daily since end of July 2023, tolerating it well,  Had cardiology evaluation, normal CMP, CBC, TSH, ALT, BMP, I reviewed cardiology evaluation by Dr. 05-11-1994 May 26, 2022, echocardiogram showed severe concentric hypertrophy up to 17 mm, Mid cavitary gradient up to 12 mm HG. Findings consistent with known hypertrophic cardiomyopathy. Left ventricular ejection fraction, by estimation, is 65 to 70%. The left ventricle has normal function. The left ventricle has no regional wall motion abnormalities. There is severe concentric left ventricular hypertrophy. Left ventricular diastolic parameters were normal. 1.Right ventricular systolic function is normal. The right ventricular size is normal.   Since  2021, she noticed left ankle inversion, plantarflexion,  few  months ago in June 2023, he developed significant ankle pain, could no longer bear weight, was seen by podiatrist, x-ray showed no broken bone, wear boot for few weeks, but since then, it has become increased difficulty for her to bear weight with her left ankle, give out underneath her, it has affected her life quality  Family is wondering if Botox injection would be the solution     PHYSICAL EXAM:   Vitals:   09/21/22 1325  BP: 118/76  Pulse: 80  Height: 5\' 9"  (1.753 m)     Body mass index is 22.15 kg/m.  PHYSICAL EXAMNIATION:  Gen: NAD, conversant, well nourised, well groomed                     Cardiovascular: Regular rate rhythm, no peripheral edema, warm, nontender. Eyes: Conjunctivae clear without exudates or hemorrhage Neck: Supple, no carotid bruits. Pulmonary: Clear to auscultation bilaterally  Skeleton: Significant scoliosis  NEUROLOGICAL EXAM:  MENTAL STATUS: Speech/cognition: Slow spastic dysarthria CRANIAL NERVES: CN II: Visual fields are full to confrontation. Pupils are round equal and briskly reactive to light. CN III, IV, VI: extraocular movement are normal. No ptosis. CN V: Facial sensation is intact to light touch CN VII: Face is symmetric with normal eye closure  CN VIII: Hearing is normal to causal conversation. CN IX, X: Phonation is normal. CN XI: Head turning and shoulder shrug are intact  MOTOR: Bilateral upper and lower extremity proximal and distal muscle strengths 4 out of 5, significant dysmetria, significant tenderness at the left inner part of lateral malleolus  REFLEXES: Areflexia  SENSORY: Intact to light touch, vibratory sensation and pinprick  COORDINATION: mild to moderate truncal ataxia, significant bilateral upper and lower extremity dysmetria left worse than right  GAIT/STANCE: Deferred  REVIEW OF SYSTEMS:  Full 14 system review of systems performed and notable only for as above All other review of systems were  negative.   ALLERGIES: No Known Allergies  HOME MEDICATIONS: Current Outpatient Medications  Medication Sig Dispense Refill   Omaveloxolone (SKYCLARYS) 50 MG CAPS Take 150 mg by mouth daily. 90 capsule 11   TRI-ESTARYLLA 0.18/0.215/0.25 MG-35 MCG tablet Take 1 tablet by mouth daily.     No current facility-administered medications for this visit.    PAST MEDICAL HISTORY: Past Medical History:  Diagnosis Date   [name] (HCC)     PAST SURGICAL HISTORY: History reviewed. No pertinent surgical history.  FAMILY HISTORY: Family History  Problem Relation Age of Onset   Obsessive Compulsive Disorder Father    Coronary artery disease Father     SOCIAL HISTORY: Social History   Socioeconomic History   Marital status: Single    Spouse name: Not on file   Number of children: Not on file   Years of education: Not on file   Highest education level: Not on file  Occupational History   Not on file  Tobacco Use   Smoking status: Former    Types: Cigarettes    Quit date: 12/06/2020    Years since quitting: 1.7   Smokeless tobacco: Never  Vaping Use   Vaping Use: Never used  Substance and Sexual Activity   Alcohol use: Not Currently   Drug use: Yes    Frequency: 7.0 times per week    Types: Marijuana    Comment: daily use    Sexual activity: Not Currently    Birth control/protection: Pill  Other Topics Concern   Not on file  Social History Narrative   Not on file   Social Determinants of Health   Financial Resource Strain: Not on file  Food Insecurity: Not on file  Transportation Needs: Not on file  Physical Activity: Not on file  Stress: Not on file  Social Connections: Not on file  Intimate Partner Violence: Not on file     Levert Feinstein, M.D. Ph.D.  [facility] [address], [address] [place], [place] [address] Ph: [phone] Fax: [phone]  CC:  Levert Feinstein, MD [address] [place],  [place] [address]  Levert Feinstein,  MD   Total time spent reviewing the chart, obtaining history, examined patient, ordering tests, documentation, consultations and family, care coordination was  45 minutes

## 2022-09-25 ENCOUNTER — Other Ambulatory Visit: Payer: Self-pay | Admitting: Podiatry

## 2022-09-25 DIAGNOSIS — M25372 Other instability, left ankle: Secondary | ICD-10-CM

## 2022-09-27 ENCOUNTER — Telehealth: Payer: Self-pay | Admitting: *Deleted

## 2022-09-27 NOTE — Telephone Encounter (Signed)
Prior authorization request for MRI ankle, left w/o contrast per hospital scheduling. Contacted [employer]:contact:Kelly W Case #[id number] Valid date: 09/27/22 thru 03/26/23 Authorization #:[id number] Scheduling has been updated.

## 2022-09-28 ENCOUNTER — Telehealth: Payer: Self-pay | Admitting: *Deleted

## 2022-09-28 NOTE — Telephone Encounter (Signed)
Updated Aur[employer]ization # for [id number].Valid from 09/27/22-03/26/23

## 2022-10-02 ENCOUNTER — Ambulatory Visit (HOSPITAL_COMMUNITY)
Admission: RE | Admit: 2022-10-02 | Discharge: 2022-10-02 | Disposition: A | Discharge status: Home / Self Care | Payer: Medicare HMO | Source: Ambulatory Visit | Attending: Podiatry | Admitting: Podiatry

## 2022-10-02 DIAGNOSIS — M25472 Effusion, left ankle: Secondary | ICD-10-CM | POA: Diagnosis not present

## 2022-10-02 DIAGNOSIS — R600 Localized edema: Secondary | ICD-10-CM | POA: Diagnosis not present

## 2022-10-02 DIAGNOSIS — M25372 Other instability, left ankle: Secondary | ICD-10-CM | POA: Diagnosis not present

## 2022-10-02 DIAGNOSIS — M65872 Other synovitis and tenosynovitis, left ankle and foot: Secondary | ICD-10-CM | POA: Diagnosis not present

## 2022-10-17 ENCOUNTER — Other Ambulatory Visit: Payer: Self-pay | Admitting: Podiatry

## 2022-10-17 ENCOUNTER — Encounter: Payer: Self-pay | Admitting: Podiatry

## 2022-10-17 DIAGNOSIS — M25372 Other instability, left ankle: Secondary | ICD-10-CM

## 2022-10-30 ENCOUNTER — Ambulatory Visit: Payer: Medicare HMO | Admitting: Neurology

## 2022-10-31 ENCOUNTER — Encounter (HOSPITAL_BASED_OUTPATIENT_CLINIC_OR_DEPARTMENT_OTHER): Payer: Self-pay | Admitting: Physical Therapy

## 2022-10-31 ENCOUNTER — Ambulatory Visit (HOSPITAL_BASED_OUTPATIENT_CLINIC_OR_DEPARTMENT_OTHER): Payer: Medicare HMO | Attending: Neurology | Admitting: Physical Therapy

## 2022-10-31 DIAGNOSIS — R2689 Other abnormalities of gait and mobility: Secondary | ICD-10-CM | POA: Diagnosis not present

## 2022-10-31 DIAGNOSIS — M25372 Other instability, left ankle: Secondary | ICD-10-CM | POA: Insufficient documentation

## 2022-10-31 DIAGNOSIS — M25672 Stiffness of left ankle, not elsewhere classified: Secondary | ICD-10-CM | POA: Diagnosis not present

## 2022-10-31 DIAGNOSIS — M25572 Pain in left ankle and joints of left foot: Secondary | ICD-10-CM | POA: Diagnosis not present

## 2022-10-31 DIAGNOSIS — M6281 Muscle weakness (generalized): Secondary | ICD-10-CM | POA: Diagnosis not present

## 2022-10-31 NOTE — Therapy (Signed)
OUTPATIENT PHYSICAL THERAPY LOWER EXTREMITY EVALUATION   Patient Name: Robin Glenn MRN: [id number] DOB:March 06, 1991, 31 y.o., female Today's Date: 11/01/2022  END OF SESSION:  PT End of Session - 10/31/22 1618     Visit Number 1    Number of Visits 12    Date for PT Re-Evaluation 12/12/22    Authorization Type AETNA MCR    PT Start Time 1522    PT Stop Time 1608    PT Time Calculation (min) 46 min    Activity Tolerance Patient tolerated treatment well    Behavior During Therapy WFL for tasks assessed/performed             Past Medical History:  Diagnosis Date   [name] ([place])    History reviewed. No pertinent surgical history. 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   June and August Pt states her ankle has been turning inward more and more over the years.  Pt used a brace which helps.  She was not wearing the brace and turned her ankle when performing a stand up transfer at home.  Pt saw MD who thought she may have torn a ligament.  Pt had x rays which showed no fracture.  MD put pt in a boot for 6 weeks.  Pt had no improvement in sx's and pt was placed in a customized brace.   Pt states she is unable to put any weight on foot without  brace.    Pt requires assistance with transfers including in shower at home.  Pt has difficulty with pulling pants up.  She has difficulty with foot staying on pedal due to moving inward.  Pt has increased difficulty with lifting L LE on pedal and c/o's of L LE weakness.  Pt still sees personal trainer though is more limited with mobilty and with activities.   Pt doesn't walk and just performs transfers.  Pt unable to ambulate a few steps with rollator.   Pt Dr. Krista Blue highly recommended water aerobic for worsening L ankle pain  she noticed left ankle inversion, plantarflexion, few months ago in June 2023, he developed significant ankle pain, could no longer bear weight, was seen by podiatrist, x-ray showed no broken bone, wear boot for few weeks, but since then, it has become increased difficulty for her to bear weight with her left ankle, give out underneath her, it has affected her life quality   Pt has gone to [facility] clinic and was dx'd with frederick ataxia  which has progressed to relying on walker f/b w/c.  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  0/10 Current and best,6 /10 worst L ankle  PRECAUTIONS: Fall and Other: Requires extensive assistance with transfers  WEIGHT BEARING RESTRICTIONS: No  FALLS:  Has patient fallen in last 6 months? Yes. Number of falls 1 with transfer  LIVING ENVIRONMENT: Lives with: lives with their family Lives in: House/apartment Stairs: 2 story home Has following equipment at home: stair lift, grab bars, Rollator  OCCUPATION: Part time job as a Sales promotion account executive.    PLOF: Needs assistance with ADLs  Family assist with ADLs.  Pt in W/C.  Pt was able to walk a few mins with her  personal trainer holding her up her prior to June.  Pt able to perform transfers independently at home with grab bars and required assistance out in the community.     PATIENT GOALS: to improve strength of ankle, increase Wb'ing on L ankle, perform mobility without brace, walk a couple of mins on TM    OBJECTIVE:   DIAGNOSTIC FINDINGS:  X rays of L ankle:  No fracture.  L ankle MRI on 10/02/2022: IMPRESSION: 1. Mild tenosynovitis of the peroneal tendons. 2. Ligaments of the medial and lateral ankle ligaments are intact. 3. Small ankle joint effusion. 4. No evidence of fracture or osteonecrosis. 5. Mild edema of the Kager's fat pad. No fluid collection or hematoma.  PATIENT SURVEYS:  {rehab surveys:24030}  COGNITION: Overall cognitive status: Within functional limits for tasks assessed      Gait examination shows with limitation weakening of the peroneal tendon with inversion and plantarflexion of the foot. Patient has consistent finding with weakness of the anterior muscle as well consistent with dropfoot.    LOWER EXTREMITY ROM:   ROM Right eval Left eval  Hip flexion    Hip extension    Hip abduction    Hip adduction    Hip internal rotation    Hip external rotation    Knee flexion    Knee extension    Ankle dorsiflexion  11 deg from neutral  Ankle plantarflexion    Ankle inversion  31  Ankle eversion  Pt unable to perform eversion; PROM: 4   (Blank rows = not tested)  LOWER EXTREMITY MMT:  MMT Right eval Left eval  Hip flexion    Hip extension    Hip abduction    Hip adduction    Hip internal rotation    Hip external rotation    Knee flexion    Knee extension    Ankle dorsiflexion    Ankle plantarflexion    Ankle inversion    Ankle eversion  Pt unable to perform eversion   (Blank rows = not tested)    FUNCTIONAL TESTS:  {Functional tests:24029}  GAIT: Distance walked: *** Assistive device utilized: {Assistive devices:23999} Level of  assistance: {Levels of assistance:24026} Comments: *** Pt's L foot severely inverted with standing.   Pt sits with L foot inverted on pedal.   TODAY'S TREATMENT:  DATE: ***    PATIENT EDUCATION:  Education details: *** Person educated: {Person educated:25204} Education method: {Education Method:25205} Education comprehension: {Education Comprehension:25206}  HOME EXERCISE PROGRAM: ***  ASSESSMENT:  CLINICAL IMPRESSION: Patient is a 31 y.o. female with a dx of L ankle instability presenting to the clinic with L ankle pain, limited ankle ROM, muscle weakness, and gait abnormality.  ho was seen today for physical therapy evaluation and treatment for ***.   Pt unable to perform eversion  OBJECTIVE IMPAIRMENTS: {opptimpairments:25111}.   ACTIVITY LIMITATIONS: {activitylimitations:27494}  PARTICIPATION LIMITATIONS: {participationrestrictions:25113}  PERSONAL FACTORS: {Personal factors:25162} are also affecting patient's functional outcome.   REHAB POTENTIAL: {rehabpotential:25112}  CLINICAL DECISION MAKING: {clinical decision making:25114}  EVALUATION COMPLEXITY: {Evaluation complexity:25115}   GOALS: Goals reviewed with patient? {yes/no:20286}  SHORT TERM GOALS: Target date: *** *** Baseline: Goal status: {GOALSTATUS:25110}  2.  *** Baseline:  Goal status: {GOALSTATUS:25110}  3.  *** Baseline:  Goal status: {GOALSTATUS:25110}  4.  *** Baseline:  Goal status: {GOALSTATUS:25110}  5.  *** Baseline:  Goal status: {GOALSTATUS:25110}  6.  *** Baseline:  Goal status: {GOALSTATUS:25110}  LONG TERM GOALS: Target date: ***  *** Baseline:  Goal status: {GOALSTATUS:25110}  2.  *** Baseline:  Goal status: {GOALSTATUS:25110}  3.  *** Baseline:  Goal status: {GOALSTATUS:25110}  4.  *** Baseline:  Goal status:  {GOALSTATUS:25110}  5.  *** Baseline:  Goal status: {GOALSTATUS:25110}  6.  *** Baseline:  Goal status: {GOALSTATUS:25110}   PLAN:  PT FREQUENCY: 1-2x/week  PT DURATION: 6 weeks  PLANNED INTERVENTIONS: {rehab planned interventions:25118::"Therapeutic exercises","Therapeutic activity","Neuromuscular re-education","Balance training","Gait training","Patient/Family education","Self Care","Joint mobilization"}  PLAN FOR NEXT SESSION: Aaron Edelman, PT 11/01/2022, 11:13 PM

## 2022-11-01 ENCOUNTER — Other Ambulatory Visit: Payer: Self-pay

## 2022-11-01 DIAGNOSIS — H5213 Myopia, bilateral: Secondary | ICD-10-CM | POA: Diagnosis not present

## 2022-11-03 ENCOUNTER — Ambulatory Visit: Payer: Medicare HMO | Admitting: Podiatry

## 2022-11-03 ENCOUNTER — Encounter (HOSPITAL_BASED_OUTPATIENT_CLINIC_OR_DEPARTMENT_OTHER): Payer: Self-pay | Admitting: Physical Therapy

## 2022-11-03 ENCOUNTER — Ambulatory Visit (HOSPITAL_BASED_OUTPATIENT_CLINIC_OR_DEPARTMENT_OTHER): Payer: Medicare HMO | Admitting: Physical Therapy

## 2022-11-03 DIAGNOSIS — R2689 Other abnormalities of gait and mobility: Secondary | ICD-10-CM | POA: Diagnosis not present

## 2022-11-03 DIAGNOSIS — M25672 Stiffness of left ankle, not elsewhere classified: Secondary | ICD-10-CM

## 2022-11-03 DIAGNOSIS — M25572 Pain in left ankle and joints of left foot: Secondary | ICD-10-CM | POA: Diagnosis not present

## 2022-11-03 DIAGNOSIS — M25372 Other instability, left ankle: Secondary | ICD-10-CM | POA: Diagnosis not present

## 2022-11-03 DIAGNOSIS — M6281 Muscle weakness (generalized): Secondary | ICD-10-CM | POA: Diagnosis not present

## 2022-11-03 NOTE — Therapy (Signed)
OUTPATIENT PHYSICAL THERAPY TREATMENT NOTE   Patient Name: Robin GrapesLily Glenn MRN: [id number] DOB:1991/02/29, 31 y.o., female Today's Date: 11/03/2022  END OF SESSION:  PT End of Session - 11/03/22 1159     Visit Number 2    Number of Visits 12    Date for PT Re-Evaluation 12/12/22    Authorization Type [employer] MCR    PT Start Time 1155    PT Stop Time 1235    PT Time Calculation (min) 40 min    Equipment Utilized During Treatment Gait belt    Activity Tolerance Patient tolerated treatment well    Behavior During Therapy WFL for tasks assessed/performed             Past Medical History:  Diagnosis Date   [name] (HCC)    History reviewed. No pertinent surgical history. 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    Pt denies any adverse effects after prior Rx.  Pt reports she has been performing DF AROM at home.  Pt's boyfriend states she hasn't used her rollator in 6 months.  Pt states they put weights in rollator for improved stability.   Pt has increased difficulty with performing transfers currently and requires assistance with transfers including in shower at home.  Pt has difficulty with pulling pants up.  She has difficulty with foot staying on pedal  due to moving inward.  Pt has increased difficulty with lifting L LE on pedal and c/o's of L LE weakness.  Pt works out with a Systems analystpersonal trainer 3x/wk though is more limited with mobilty and with activities.    PERTINENT HISTORY: [name]--pt is in a W/C.  She requires maximum assist with transfers.  Pt required assistance with sitting.   Pt is a FALL RISK.  Use a gait belt.    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  0/10 Current and best, 6/10 worst L ankle  PRECAUTIONS: Fall and Other: Requires extensive assistance with transfers  WEIGHT BEARING RESTRICTIONS: No  FALLS:  Has patient fallen in last 6 months? Yes. 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    PLOF: Needs assistance with ADLs  Family assist with ADLs.  Pt in W/C.  Pt was able to walk a few mins with her personal trainer holding her up her prior to June.  Pt able to perform transfers independently at home with grab bars and required assistance out in the community.  Pt was able to take a few steps with rollator at home.    PATIENT GOALS: to improve strength of ankle, increase Wb'ing  on L ankle, perform mobility without brace, walk a couple of mins on TM. 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  L ankle MRI on 10/02/2022: IMPRESSION: 1. Mild tenosynovitis of the peroneal tendons. 2. Ligaments of the medial and lateral ankle ligaments are intact. 3. Small ankle joint effusion. 4. No evidence of fracture or osteonecrosis. 5. Mild edema of the Kager's fat pad. No fluid collection or hematoma.  TODAY'S  TREATMENT:  PATIENT SURVEYS:   FOTO:  38 with a goal of 62 at visit #15.   POSTURE: Pt sits in W/C with L ankle in inversion and PF on pedal.  Pt has a customized brace on L ankle.                                                                                                                              Therapeutic Activity: PT worked on [employer] with L foot in neutral position.  Pt required +2 max assist with standing.  Pt stood with +2 max assist with UE support on table in front of her and cuing and instruction to keep foot in neutral position, to decrease inversion, to stand straight, and to keep heel down.  Pt also attempted to perform weight shift to increase Wb'ing on L LE.  Pt performed 2 reps and was wearing her customized brace. Pt had 2-3/10 pain with standing which went away with sitting.   Therapeutic Exercise: Pt received L ankle PROM in DF and eve.  Pt performed: Ankle DF AROM 2x10 DF manually resisted isometrics with 3 sec hold. Attempted Eversion isometric though pt unable to perform.  Eversion with foot on half roll with PT assistance for stability and movement.    PATIENT EDUCATION:  Education details: PT answered pt's and boyfriend's questions.  POC, HEP, exercise form, exercise rationale.  PT educated pt and boyfriend concerning the neuro PT clinic may be more suitable for pt to assist with standing and mobility exercises due to the equipment they would have including parallel bars.  Also they may be more beneficial when she starts aquatic therapy.   Person educated:  Pt and boyfriend Education method: Explanation, Demonstration, Tactile cues, and Verbal cues Education comprehension: verbalized understanding, returned demonstration, verbal cues required, tactile cues required, and needs further education  HOME EXERCISE PROGRAM: Ankle DF AROM 2x10, 2x/day  ASSESSMENT:  CLINICAL IMPRESSION: Pt's boyfriend present during Rx and assisted with standing pt and  helping her maintain standing with therapist.  Pt requires max assist +2 with standing up from transfer and to maintain standing.  Pt doesn't have control with standing and is unable to stand without significant assistance and apply weight properly.  Pt's L foot significantly inverts and she has significantly decreased weight thru L LE.  She also bears weight toward the front of her feet/toes with her heels  coming off of the floor.  PT instructed pt in keeping her heels down and manually positioned her foot in the correct neutral position.  PT had pt to try to increase wt shift to L LE though pt lacks control.  Pt has severe peroneal weakness and was unable to perform any eversion into therapist's hand to perform isometric.  PT is able to perform eversion PROM.  PT spoke with pt and boyfriend that she may benefit more from the neuro PT clinic due to equipment they would have if she is not making progress here.  She responded well to Rx having no pain and no c/o's after Rx.   Pt may benefit from skilled PT services to address impairments and to improve overall function.      OBJECTIVE IMPAIRMENTS: Abnormal gait, decreased activity tolerance, decreased balance, decreased coordination, decreased endurance, decreased mobility, difficulty walking, decreased ROM, decreased strength, hypomobility, impaired flexibility, postural dysfunction, and pain.   ACTIVITY LIMITATIONS: standing, stairs, transfers, bed mobility, dressing, and locomotion level  PARTICIPATION LIMITATIONS: community activity and workout activities  PERSONAL FACTORS: 1 comorbidity: [name] and Progression of sx's and mobility deficits  are also affecting patient's functional outcome.   REHAB POTENTIAL: Fair due to Freidreich's ataxia, progression of sx's, and mobility deficits  CLINICAL DECISION MAKING: Evolving/moderate complexity  EVALUATION COMPLEXITY: Moderate   GOALS:  SHORT TERM GOALS: Target date:  11/22/2022   Pt  will be able to actively perform eversion for improved ankle position and strength.  Baseline: Goal status: INITIAL  2.  Pt will demo improved positioning of L ankle in W/C based upon visual observation for improved activation of peroneals and proper length tension relationship for ankle mm for improved stability.   Baseline:  Goal status: INITIAL  3.  Pt will demo improved ankle DF AROM to neutral for improved ankle mobility.  Baseline:  Goal status: INITIAL  4.  Pt will report at least a 25% improvement in mobility.  Baseline:  Goal status: INITIAL Target date: 11/29/2022   LONG TERM GOALS: Target date: 12/12/2022  Pt will report she is able to perform transfers at home with grab bars independently.   Baseline:  Goal status: INITIAL  2.  Pt will be able to perform transfers in the clinic with min assist.   Baseline:  Goal status: INITIAL  3.  Pt will demo improved L ankle AROM to 5-10 deg in DF and 5 deg in Eve for improved ankle ROM and positioning with mobility.  Baseline:  Goal status: INITIAL  4.  Pt will be able to tolerate 4-5 mins of standing with min assist with UE support for improved tolerance with mobility and activities around her home.  Baseline:  Goal status: INITIAL  5.  Pt will be able to tolerate minimal to moderate manual resistance in L ankle DF and Eve for improved tolerance with Wb'ing and performance of transfers and ambulation.  Baseline:  Goal status: INITIAL    PLAN:  PT FREQUENCY: 1-2x/week  PT DURATION: 6 weeks  PLANNED INTERVENTIONS: Therapeutic exercises, Therapeutic activity, Neuromuscular re-education, Balance training, Gait training, Patient/Family education, Self Care, Joint mobilization, DME instructions, Aquatic Therapy, Dry Needling, Electrical stimulation, Cryotherapy, Moist heat, Taping, Manual therapy, and Re-evaluation  PLAN FOR NEXT SESSION:  Pt is a FALL RISK.  Pt requires max assist +2 to perform transfer. Use a gait belt.   Ankle DF AROM and eve PROM.  Ant tib and peroneals submax isometrics.  Attempt towel slide.  Pt will  require assistance with sitting.  Pt may transition to aquatics when pt has improved with transfers and is able to safely enter pool.  Pt may benefit from seeing neuro PT.     Audie Clear III PT, DPT 11/03/22 4:01 PM

## 2022-11-14 ENCOUNTER — Ambulatory Visit (HOSPITAL_BASED_OUTPATIENT_CLINIC_OR_DEPARTMENT_OTHER): Payer: Medicare HMO | Attending: Neurology | Admitting: Physical Therapy

## 2022-11-14 ENCOUNTER — Encounter (HOSPITAL_BASED_OUTPATIENT_CLINIC_OR_DEPARTMENT_OTHER): Payer: Self-pay | Admitting: Physical Therapy

## 2022-11-14 DIAGNOSIS — M25672 Stiffness of left ankle, not elsewhere classified: Secondary | ICD-10-CM | POA: Insufficient documentation

## 2022-11-14 DIAGNOSIS — M6281 Muscle weakness (generalized): Secondary | ICD-10-CM | POA: Diagnosis not present

## 2022-11-14 DIAGNOSIS — M25572 Pain in left ankle and joints of left foot: Secondary | ICD-10-CM | POA: Diagnosis not present

## 2022-11-14 DIAGNOSIS — R2689 Other abnormalities of gait and mobility: Secondary | ICD-10-CM | POA: Diagnosis not present

## 2022-11-14 NOTE — Therapy (Signed)
OUTPATIENT PHYSICAL THERAPY TREATMENT NOTE   Patient Name: Robin Glenn MRN: [id number] DOB:10-Feb-1991, 32 y.o., female Today's Date: 11/14/2022  END OF SESSION:  PT End of Session - 11/14/22 1241     Visit Number 3    Number of Visits 12    Date for PT Re-Evaluation 12/12/22    Authorization Type [employer] MCR    PT Start Time 1146    PT Stop Time 1230    PT Time Calculation (min) 44 min    Equipment Utilized During Treatment Gait belt    Activity Tolerance Patient tolerated treatment well    Behavior During Therapy WFL for tasks assessed/performed              Past Medical History:  Diagnosis Date   [name] ([place])    History reviewed. No pertinent surgical history. 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     Pt states she has no pain after last sesion and no soreness. NO pain at today's session:  Previous:   Pt denies any adverse effects after prior Rx.  Pt reports she has been performing DF AROM at home.  Pt's boyfriend states she hasn't used her rollator in 6 months.  Pt states they put weights in rollator for improved stability.   Pt has increased difficulty with performing transfers currently and requires assistance with transfers including  in shower at home.  Pt has difficulty with pulling pants up.  She has difficulty with foot staying on pedal due to moving inward.  Pt has increased difficulty with lifting L LE on pedal and c/o's of L LE weakness.  Pt works out with a Physiological scientist 3x/wk though is more limited with mobilty and with activities.    PERTINENT HISTORY: [name]--pt is in a W/C.  She requires maximum assist with transfers.  Pt required assistance with sitting.   Pt is a FALL RISK.  Use a gait belt.    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  0/10 Current and best, 6/10 worst L ankle  PRECAUTIONS: Fall and Other: Requires extensive assistance with transfers  WEIGHT BEARING RESTRICTIONS: No  FALLS:  Has patient fallen in last 6 months? Yes. Number of falls 1 with transfer  LIVING ENVIRONMENT: Lives with: lives with their family Lives in: House/apartment Stairs: 2 story home Has following equipment at home: stair lift, grab bars, Rollator  OCCUPATION: Part time job as a Sales promotion account executive.    PLOF: Needs assistance with ADLs  Family assist with ADLs.  Pt in W/C.  Pt was able to walk a few mins with her personal trainer holding her up her prior to June.  Pt able to perform transfers independently at home with grab bars and required assistance out in the community.  Pt  was able to take a few steps with rollator at home.    PATIENT GOALS: to improve strength of ankle, increase Wb'ing on L ankle, perform mobility without brace, walk a couple of mins on TM. 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  L ankle MRI on 10/02/2022: IMPRESSION: 1. Mild tenosynovitis of the peroneal tendons. 2. Ligaments of the medial and lateral ankle ligaments are intact. 3. Small ankle joint effusion. 4. No evidence of fracture or  osteonecrosis. 5. Mild edema of the Kager's fat pad. No fluid collection or hematoma.  TODAY'S TREATMENT:  PATIENT SURVEYS:   FOTO:  38 with a goal of 62 at visit #15.                                                                                                                              Therapeutic Activity: Standing weighshift with caregiver assist- slow dancing position Edu given to boyfriend regarding position, body mechanics, safety, and guarding techniques. Emphasis places on neutral ankle- performed in brace, consider no brace at next session given limitation of ankle ROM with brace in place- unable to achieve pronation position  Therapeutic Exercise: Ankle DF AROM 2x10 with 5s hold into DF Manually assisted ankle circles CW and CCW 2x10 each way   PATIENT EDUCATION:  Education details: anatomy, exercise progression,  muscle firing,  HEP, POC  Person educated:  Pt and boyfriend Education method: Explanation, Demonstration, Tactile cues, and Verbal cues Education comprehension: verbalized understanding, returned demonstration, verbal cues required, tactile cues required, and needs further education  HOME EXERCISE PROGRAM: Access Code: [email or web address] URL: [email or web address] Date: 11/14/2022 Prepared by: Daleen Bo  Exercises - Supine Ankle Circles  - 1 x daily - 7 x weekly - 2 sets - 10 reps - Supine Ankle Dorsiflexion and Plantarflexion AROM  - 1 x daily - 7 x weekly - 2 sets - 10 reps - Seated Hip Abduction with Resistance  - 1 x daily - 7 x weekly - 3 sets - 10 reps - Standing Weight Shift  - 1 x daily - 7 x weekly - 1 sets - 1 reps - 5 min total hold  ASSESSMENT:  CLINICAL IMPRESSION: Patient presents with continued left ankle stiffness and tightness.  Patient lacks L ankle dorsiflexion which conversely also affects patient's ability to perform ankle eversion.  Patient did have good response to assisted stretching with improvement and calf Soft tissue  restriction and allowed for more active eversion when assisted.  Patient does have motor control deficits with left ankle especially going into circumduction but was able to complete with clinician assist.  Patient given weightbearing exercise for home with caregiver assist with emphasis on foot flat position left extremity weightbearing and proper hip positioning.  Plan to continue with ankle dorsiflexion stretching as tolerated to improve left ankle stiffness.  Consider  transition to aquatic therapy to improve weightbearing tolerance although patient may respond to land-based weightbearing exercises given patient is seeing personal trainer as well.  Pt may benefit from skilled PT services to address impairments and to improve overall function.      OBJECTIVE IMPAIRMENTS: Abnormal gait, decreased activity tolerance, decreased balance, decreased coordination, decreased endurance, decreased mobility, difficulty walking, decreased ROM, decreased strength, hypomobility, impaired flexibility, postural dysfunction, and pain.   ACTIVITY LIMITATIONS: standing, stairs, transfers, bed mobility, dressing, and locomotion level  PARTICIPATION LIMITATIONS: community activity and workout activities  PERSONAL FACTORS: 1 comorbidity: [name] and Progression of sx's and mobility deficits  are also affecting patient's functional outcome.   REHAB POTENTIAL: Fair due to Freidreich's ataxia, progression of sx's, and mobility deficits  CLINICAL DECISION MAKING: Evolving/moderate complexity  EVALUATION COMPLEXITY: Moderate   GOALS:  SHORT TERM GOALS: Target date:  11/22/2022   Pt will be able to actively perform eversion for improved ankle position and strength.  Baseline: Goal status: INITIAL  2.  Pt will demo improved positioning of L ankle in W/C based upon visual observation for improved activation of peroneals and proper length tension relationship for ankle mm for improved stability.   Baseline:   Goal status: INITIAL  3.  Pt will demo improved ankle DF AROM to neutral for improved ankle mobility.  Baseline:  Goal status: INITIAL  4.  Pt will report at least a 25% improvement in mobility.  Baseline:  Goal status: INITIAL Target date: 11/29/2022   LONG TERM GOALS: Target date: 12/12/2022  Pt will report she is able to perform transfers at home with grab bars independently.   Baseline:  Goal status: INITIAL  2.  Pt will be able to perform transfers in the clinic with min assist.   Baseline:  Goal status: INITIAL  3.  Pt will demo improved L ankle AROM to 5-10 deg in DF and 5 deg in Eve for improved ankle ROM and positioning with mobility.  Baseline:  Goal status: INITIAL  4.  Pt will be able to tolerate 4-5 mins of standing with min assist with UE support for improved tolerance with mobility and activities around her home.  Baseline:  Goal status: INITIAL  5.  Pt will be able to tolerate minimal to moderate manual resistance in L ankle DF and Eve for improved tolerance with Wb'ing and performance of transfers and ambulation.  Baseline:  Goal status: INITIAL    PLAN:  PT FREQUENCY: 1-2x/week  PT DURATION: 6 weeks  PLANNED INTERVENTIONS: Therapeutic exercises, Therapeutic activity, Neuromuscular re-education, Balance training, Gait training, Patient/Family education, Self Care, Joint mobilization, DME instructions, Aquatic Therapy, Dry Needling, Electrical stimulation, Cryotherapy, Moist heat, Taping, Manual therapy, and Re-evaluation  PLAN FOR NEXT SESSION:  Pt is a FALL RISK.  Pt requires max assist +2 to perform transfer. Use a gait belt.  Ankle DF AROM and eve PROM.  Ant tib and peroneals submax isometrics.  Ankle DF strap stretch. Pt will require assistance with sitting.  Pt may transition to aquatics when pt has improved with transfers and is able to safely enter pool.  Pt may benefit from seeing neuro PT.    Zebedee Iba PT, DPT 11/14/22 12:47 PM

## 2022-11-16 ENCOUNTER — Ambulatory Visit (HOSPITAL_BASED_OUTPATIENT_CLINIC_OR_DEPARTMENT_OTHER): Payer: Medicare HMO | Admitting: Physical Therapy

## 2022-11-16 ENCOUNTER — Encounter (HOSPITAL_BASED_OUTPATIENT_CLINIC_OR_DEPARTMENT_OTHER): Payer: Self-pay | Admitting: Physical Therapy

## 2022-11-16 DIAGNOSIS — M6281 Muscle weakness (generalized): Secondary | ICD-10-CM | POA: Diagnosis not present

## 2022-11-16 DIAGNOSIS — M25672 Stiffness of left ankle, not elsewhere classified: Secondary | ICD-10-CM

## 2022-11-16 DIAGNOSIS — M25572 Pain in left ankle and joints of left foot: Secondary | ICD-10-CM | POA: Diagnosis not present

## 2022-11-16 DIAGNOSIS — R2689 Other abnormalities of gait and mobility: Secondary | ICD-10-CM | POA: Diagnosis not present

## 2022-11-16 NOTE — Therapy (Signed)
OUTPATIENT PHYSICAL THERAPY TREATMENT NOTE   Patient Name: Robin Glenn MRN: [id number] DOB:July 12, 1991, 32 y.o., female Today's Date: 11/16/2022  END OF SESSION:  PT End of Session - 11/16/22 1258     Visit Number 4    Number of Visits 12    Date for PT Re-Evaluation 12/12/22    Authorization Type [employer]ET[employer] MCR    PT Start Time 1300    PT Stop Time 1340    PT Time Calculation (min) 40 min    Equipment Utilized During Treatment Gait belt    Activity Tolerance Patient tolerated treatment well    Behavior During Therapy WFL for tasks assessed/performed              Past Medical History:  Diagnosis Date   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 No pertinent surgical history. 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     Pt states she has no pain after last sesion and no soreness. She has been doing the HEP. She feels that the brace gets in the way of standing.   Previous:   Pt denies any adverse effects after prior Rx.  Pt reports she has been performing DF AROM at home.  Pt's boyfriend states she hasn't used her rollator in 6 months.  Pt states they put weights in rollator for improved stability.   Pt has increased difficulty with performing transfers  currently and requires assistance with transfers including in shower at home.  Pt has difficulty with pulling pants up.  She has difficulty with foot staying on pedal due to moving inward.  Pt has increased difficulty with lifting L LE on pedal and c/o's of L LE weakness.  Pt works out with a Physiological scientist 3x/wk though is more limited with mobilty and with activities.    PERTINENT HISTORY: [name]--pt is in a W/C.  She requires maximum assist with transfers.  Pt required assistance with sitting.   Pt is a FALL RISK.  Use a gait belt.    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  0/10 Current and best, 6/10 worst L ankle  PRECAUTIONS: Fall and Other: Requires extensive assistance with transfers  WEIGHT BEARING RESTRICTIONS: No  FALLS:  Has patient fallen in last 6 months? Yes. Number of falls 1 with transfer  LIVING ENVIRONMENT: Lives with: lives with their family Lives in: House/apartment Stairs: 2 story home Has following equipment at home: stair lift, grab bars, Rollator  OCCUPATION: Part time job as a Sales promotion account executive.    PLOF: Needs assistance with ADLs  Family assist with ADLs.  Pt in W/C.  Pt was able to walk a few mins with her personal trainer holding her up her prior to June.  Pt able to perform transfers independently at  home with grab bars and required assistance out in the community.  Pt was able to take a few steps with rollator at home.    PATIENT GOALS: to improve strength of ankle, increase Wb'ing on L ankle, perform mobility without brace, walk a couple of mins on TM. 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  L ankle MRI on 10/02/2022: IMPRESSION: 1. Mild tenosynovitis of the peroneal tendons. 2. Ligaments of the medial and lateral ankle ligaments are intact. 3. Small  ankle joint effusion. 4. No evidence of fracture or osteonecrosis. 5. Mild edema of the Kager's fat pad. No fluid collection or hematoma.  TODAY'S TREATMENT:  PATIENT SURVEYS:   FOTO:  38 with a goal of 62 at visit #15.                                                                                                                              Therapeutic Activity: Standing weighshift with caregiver assist- slow dancing position (caregiver with foot blocking) Edu given to boyfriend regarding position, body mechanics, safety, and guarding techniques. Emphasis places on neutral ankle- performed in brace, no brace   Therapeutic Exercise: Ankle DF AROM 2x10 with 5s hold into DF Manually assisted ankle circles CW and CCW 2x10 each way  Neuro:   NMES Russian ankle EV; 80 hz, 50% duty, 10/10 cycle, 5s ramp, 32 mA 10 mins   PATIENT EDUCATION:  Education details: anatomy, exercise progression,  muscle firing,  HEP, POC  Person educated:  Pt and boyfriend Education method: Explanation, Demonstration, Tactile cues, and Verbal cues Education comprehension: verbalized understanding, returned demonstration, verbal cues required, tactile cues required, and needs further education  HOME EXERCISE PROGRAM: Access Code: [email or web address] URL: [email or web address] Date: 11/14/2022 Prepared by: Daleen Bo  Exercises - Supine Ankle Circles  - 1 x daily - 7 x weekly - 2 sets - 10 reps - Supine Ankle Dorsiflexion and Plantarflexion AROM  - 1 x daily - 7 x weekly - 2 sets - 10 reps - Seated Hip Abduction with Resistance  - 1 x daily - 7 x weekly - 3 sets - 10 reps - Standing Weight Shift  - 1 x daily - 7 x weekly - 1 sets - 1 reps - 5 min total hold  ASSESSMENT:  CLINICAL IMPRESSION: Patient presents today with significantly less ankle joint stiffness with improved ability to get into ankle dorsiflexion and eversion with passive range of motion.  Home exercise program from previous session  reviewed with caregiver and patient in order to facilitate better foot flat position.  Patient does have mild active ankle eversion but requires either manual assist or NMES in order to facilitate ankle eversion at neutral position.  Patient with difficulty with motor control but is able to get into eversion with intermittent tactile cueing.  Plan to continue with  gait training as tolerated in the aquatic setting as well as progression of lower extremity strength and stability at the left ankle.  Patient does appear to be making progress at this time. Pt has longstanding history of L knee hyperextension which is likely contributing to ankle position. Pt may benefit from skilled PT services to address impairments and to improve overall function.      OBJECTIVE IMPAIRMENTS: Abnormal gait, decreased activity tolerance, decreased balance, decreased coordination, decreased endurance, decreased mobility, difficulty walking, decreased ROM, decreased strength, hypomobility, impaired flexibility, postural dysfunction, and pain.   ACTIVITY LIMITATIONS: standing, stairs, transfers, bed mobility, dressing, and locomotion level  PARTICIPATION LIMITATIONS: community activity and workout activities  PERSONAL FACTORS: 1 comorbidity: [name] and Progression of sx's and mobility deficits  are also affecting patient's functional outcome.   REHAB POTENTIAL: Fair due to Freidreich's ataxia, progression of sx's, and mobility deficits  CLINICAL DECISION MAKING: Evolving/moderate complexity  EVALUATION COMPLEXITY: Moderate   GOALS:  SHORT TERM GOALS: Target date:  11/22/2022   Pt will be able to actively perform eversion for improved ankle position and strength.  Baseline: Goal status: INITIAL  2.  Pt will demo improved positioning of L ankle in W/C based upon visual observation for improved activation of peroneals and proper length tension relationship for ankle mm for improved stability.   Baseline:   Goal status: INITIAL  3.  Pt will demo improved ankle DF AROM to neutral for improved ankle mobility.  Baseline:  Goal status: INITIAL  4.  Pt will report at least a 25% improvement in mobility.  Baseline:  Goal status: INITIAL Target date: 11/29/2022   LONG TERM GOALS: Target date: 12/12/2022  Pt will report she is able to perform transfers at home with grab bars independently.   Baseline:  Goal status: INITIAL  2.  Pt will be able to perform transfers in the clinic with min assist.   Baseline:  Goal status: INITIAL  3.  Pt will demo improved L ankle AROM to 5-10 deg in DF and 5 deg in Eve for improved ankle ROM and positioning with mobility.  Baseline:  Goal status: INITIAL  4.  Pt will be able to tolerate 4-5 mins of standing with min assist with UE support for improved tolerance with mobility and activities around her home.  Baseline:  Goal status: INITIAL  5.  Pt will be able to tolerate minimal to moderate manual resistance in L ankle DF and Eve for improved tolerance with Wb'ing and performance of transfers and ambulation.  Baseline:  Goal status: INITIAL    PLAN:  PT FREQUENCY: 1-2x/week  PT DURATION: 6 weeks  PLANNED INTERVENTIONS: Therapeutic exercises, Therapeutic activity, Neuromuscular re-education, Balance training, Gait training, Patient/Family education, Self Care, Joint mobilization, DME instructions, Aquatic Therapy, Dry Needling, Electrical stimulation, Cryotherapy, Moist heat, Taping, Manual therapy, and Re-evaluation  PLAN FOR NEXT SESSION:  Pt is a FALL RISK.  Pt requires max assist +2 to perform transfer. Use a gait belt.  Ankle DF AROM and eve PROM.  L quad and hip strength, L ankle NMES/facilitation, trial standing AP shift,  Zebedee Iba PT, DPT 11/16/22 1:52 PM

## 2022-11-21 ENCOUNTER — Ambulatory Visit (HOSPITAL_BASED_OUTPATIENT_CLINIC_OR_DEPARTMENT_OTHER): Payer: Medicare HMO | Admitting: Physical Therapy

## 2022-11-21 ENCOUNTER — Encounter (HOSPITAL_BASED_OUTPATIENT_CLINIC_OR_DEPARTMENT_OTHER): Payer: Self-pay | Admitting: Physical Therapy

## 2022-11-21 DIAGNOSIS — M6281 Muscle weakness (generalized): Secondary | ICD-10-CM | POA: Diagnosis not present

## 2022-11-21 DIAGNOSIS — M25672 Stiffness of left ankle, not elsewhere classified: Secondary | ICD-10-CM | POA: Diagnosis not present

## 2022-11-21 DIAGNOSIS — M25572 Pain in left ankle and joints of left foot: Secondary | ICD-10-CM

## 2022-11-21 DIAGNOSIS — R2689 Other abnormalities of gait and mobility: Secondary | ICD-10-CM | POA: Diagnosis not present

## 2022-11-21 NOTE — Therapy (Signed)
OUTPATIENT PHYSICAL THERAPY TREATMENT NOTE   Patient Name: Robin Glenn MRN: [id number] DOB:03-15-1991, 32 y.o., female Today's Date: 11/21/2022  END OF SESSION:  PT End of Session - 11/21/22 1303     Visit Number 5    Number of Visits 12    Date for PT Re-Evaluation 12/12/22    Authorization Type AET[employer] MCR    PT Start Time 1145    PT Stop Time 1230    PT Time Calculation (min) 45 min    Equipment Utilized During Treatment Gait belt    Activity Tolerance Patient tolerated treatment well    Behavior During Therapy WFL for tasks assessed/performed               Past Medical History:  Diagnosis Date   [name] ([facility])    History reviewed. No pertinent surgical history. 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    Pt states she had no issues after last session. She feels the circles are getting better and the ability to get the ankle to EV is more consistent but still spotty.   Previous:   Pt denies any adverse effects after prior Rx.  Pt reports she has been performing DF AROM at home.  Pt's boyfriend states she hasn't used her rollator in 6 months.  Pt states they put weights in rollator for improved stability.   Pt has increased difficulty with  performing transfers currently and requires assistance with transfers including in shower at home.  Pt has difficulty with pulling pants up.  She has difficulty with foot staying on pedal due to moving inward.  Pt has increased difficulty with lifting L LE on pedal and c/o's of L LE weakness.  Pt works out with a Systems analyst 3x/wk though is more limited with mobilty and with activities.    PERTINENT HISTORY: [name]--pt is in a W/C.  She requires maximum assist with transfers.  Pt required assistance with sitting.   Pt is a FALL RISK.  Use a gait belt.    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  0/10 Current and best, 6/10 worst L ankle  PRECAUTIONS: Fall and Other: Requires extensive assistance with transfers  WEIGHT BEARING RESTRICTIONS: No  FALLS:  Has patient fallen in last 6 months? Yes. Number of falls 1 with transfer  LIVING ENVIRONMENT: Lives with: lives with their family Lives in: House/apartment Stairs: 2 story home Has following equipment at home: stair lift, grab bars, Rollator  OCCUPATION: Part time job as a Runner, broadcasting/film/video.    PLOF: Needs assistance with ADLs  Family assist with ADLs.  Pt in W/C.  Pt was able to walk a few mins with her personal trainer holding her up her prior to June.  Pt able to perform transfers  independently at home with grab bars and required assistance out in the community.  Pt was able to take a few steps with rollator at home.    PATIENT GOALS: to improve strength of ankle, increase Wb'ing on L ankle, perform mobility without brace, walk a couple of mins on TM. 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  L ankle MRI on 10/02/2022: IMPRESSION: 1. Mild tenosynovitis of the peroneal tendons. 2. Ligaments of the medial and lateral ankle ligaments are  intact. 3. Small ankle joint effusion. 4. No evidence of fracture or osteonecrosis. 5. Mild edema of the Kager's fat pad. No fluid collection or hematoma.  TODAY'S TREATMENT:  PATIENT SURVEYS:   FOTO:  38 with a goal of 62 at visit #15.                                                                                                                              Therapeutic Activity: Standing weighshift with caregiver assist at cable column (heaviest setting in order to stabilize); gait belt used and LE foot flat position manually set by PT VC and TC given for weightshift at the hip vs trunk 2x10  Therapeutic Exercise: Seated Ankle DF AROM 2x10 with 5s hold into DF Manually assisted ankle circles CW and CCW 2x10 each way PROM full ankle DF  Seated self gastroc stretch with strap 30s 3x (PT assist needed for alignment) Seated hip ABD with manual block of L ankle   Neuro:   NMES Russian ankle EV; 80 hz, 50% duty, 10/10 cycle, 5s ramp, 32 mA 10 mins   PATIENT EDUCATION:  Education details: anatomy, exercise progression,  muscle firing,  HEP and HEP safety, POC  Person educated:  Pt and boyfriend Education method: Explanation, Demonstration, Tactile cues, and Verbal cues Education comprehension: verbalized understanding, returned demonstration, verbal cues required, tactile cues required, and needs further education  HOME EXERCISE PROGRAM: Access Code: [email or web address] URL: [email or web address] Date: 11/14/2022 Prepared by: Zebedee Iba  Exercises - Supine Ankle Circles  - 1 x daily - 7 x weekly - 2 sets - 10 reps - Supine Ankle Dorsiflexion and Plantarflexion AROM  - 1 x daily - 7 x weekly - 2 sets - 10 reps - Seated Hip Abduction with Resistance  - 1 x daily - 7 x weekly - 3 sets - 10 reps - Standing Weight Shift  - 1 x daily - 7 x weekly - 1 sets - 1 reps - 5 min total hold  ASSESSMENT:  CLINICAL IMPRESSION: Pt continues to make progress in neutral ankle  positioning with manual stretching and then NMES to follow. Pt able to get to ankle eversion position with ESTIM today without manual assist. Pt did have better ankle position with the L ankle in seated vs in full standing. Caregiver shown how to use gait belt  for better guarding and LE positioning. Pt able to achieve foot flat in standing with heavy TC and PT positioning of the L LE. Due to difficulty with assistance, pt advised to stop standing weight shift for now and HEP was verbally updated to include the seated ankle PF/DF exercise instead and to focus on foot flat in seated position. Plan to continue revisit NMES, standing, and ankle AROM.  Pt has longstanding history of L knee hyperextension which is likely contributing to ankle position. Pt may benefit from skilled PT services to address impairments and to improve overall function.      OBJECTIVE IMPAIRMENTS: Abnormal gait, decreased activity tolerance, decreased balance, decreased coordination, decreased endurance, decreased mobility, difficulty walking, decreased ROM, decreased strength, hypomobility, impaired flexibility, postural dysfunction, and pain.   ACTIVITY LIMITATIONS: standing, stairs, transfers, bed mobility, dressing, and locomotion level  PARTICIPATION LIMITATIONS: community activity and workout activities  PERSONAL FACTORS: 1 comorbidity: [name] and Progression of sx's and mobility deficits  are also affecting patient's functional outcome.   REHAB POTENTIAL: Fair due to Freidreich's ataxia, progression of sx's, and mobility deficits  CLINICAL DECISION MAKING: Evolving/moderate complexity  EVALUATION COMPLEXITY: Moderate   GOALS:  SHORT TERM GOALS: Target date:  11/22/2022   Pt will be able to actively perform eversion for improved ankle position and strength.  Baseline: Goal status: INITIAL  2.  Pt will demo improved positioning of L ankle in W/C based upon visual observation for improved activation of  peroneals and proper length tension relationship for ankle mm for improved stability.   Baseline:  Goal status: INITIAL  3.  Pt will demo improved ankle DF AROM to neutral for improved ankle mobility.  Baseline:  Goal status: INITIAL  4.  Pt will report at least a 25% improvement in mobility.  Baseline:  Goal status: INITIAL Target date: 11/29/2022   LONG TERM GOALS: Target date: 12/12/2022  Pt will report she is able to perform transfers at home with grab bars independently.   Baseline:  Goal status: INITIAL  2.  Pt will be able to perform transfers in the clinic with min assist.   Baseline:  Goal status: INITIAL  3.  Pt will demo improved L ankle AROM to 5-10 deg in DF and 5 deg in Eve for improved ankle ROM and positioning with mobility.  Baseline:  Goal status: INITIAL  4.  Pt will be able to tolerate 4-5 mins of standing with min assist with UE support for improved tolerance with mobility and activities around her home.  Baseline:  Goal status: INITIAL  5.  Pt will be able to tolerate minimal to moderate manual resistance in L ankle DF and Eve for improved tolerance with Wb'ing and performance of transfers and ambulation.  Baseline:  Goal status: INITIAL    PLAN:  PT FREQUENCY: 1-2x/week  PT DURATION: 6 weeks  PLANNED INTERVENTIONS: Therapeutic exercises, Therapeutic activity, Neuromuscular re-education, Balance training, Gait training, Patient/Family education, Self Care, Joint mobilization, DME instructions, Aquatic Therapy, Dry Needling, Electrical stimulation, Cryotherapy, Moist heat, Taping, Manual therapy, and Re-evaluation  PLAN FOR NEXT SESSION:  Pt is a FALL RISK.  Pt requires max assist +2 to perform transfer. Use a gait belt.  Ankle DF AROM and eve PROM.  L quad and hip strength,   Daleen Bo PT, DPT 11/21/22 1:03 PM

## 2022-11-23 ENCOUNTER — Ambulatory Visit (HOSPITAL_BASED_OUTPATIENT_CLINIC_OR_DEPARTMENT_OTHER): Payer: Medicare HMO | Admitting: Physical Therapy

## 2022-11-23 ENCOUNTER — Encounter (HOSPITAL_BASED_OUTPATIENT_CLINIC_OR_DEPARTMENT_OTHER): Payer: Medicare HMO | Admitting: Physical Therapy

## 2022-11-23 ENCOUNTER — Encounter (HOSPITAL_BASED_OUTPATIENT_CLINIC_OR_DEPARTMENT_OTHER): Payer: Self-pay | Admitting: Physical Therapy

## 2022-11-23 DIAGNOSIS — M6281 Muscle weakness (generalized): Secondary | ICD-10-CM | POA: Diagnosis not present

## 2022-11-23 DIAGNOSIS — M25672 Stiffness of left ankle, not elsewhere classified: Secondary | ICD-10-CM

## 2022-11-23 DIAGNOSIS — M25572 Pain in left ankle and joints of left foot: Secondary | ICD-10-CM

## 2022-11-23 DIAGNOSIS — R2689 Other abnormalities of gait and mobility: Secondary | ICD-10-CM | POA: Diagnosis not present

## 2022-11-23 NOTE — Therapy (Signed)
OUTPATIENT PHYSICAL THERAPY TREATMENT NOTE   Patient Name: Robin Glenn MRN: [id number] DOB:07-14-1991, 32 y.o., female Today's Date: 11/23/2022  END OF SESSION:  PT End of Session - 11/23/22 1257     Visit Number 6    Number of Visits 12    Date for PT Re-Evaluation 12/12/22    Authorization Type [employer] MCR    PT Start Time 1300    PT Stop Time 1340    PT Time Calculation (min) 40 min    Equipment Utilized During Treatment Gait belt    Activity Tolerance Patient tolerated treatment well    Behavior During Therapy WFL for tasks assessed/performed               Past Medical History:  Diagnosis Date   [name] (HCC)    History reviewed. No pertinent surgical history. 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    Pt states she had no issues after last session. No signficant changes since last session.   Previous:   Pt denies any adverse effects after prior Rx.  Pt reports she has been performing DF AROM at home.  Pt's boyfriend states she hasn't used her rollator in 6 months.  Pt states they put weights in rollator for improved stability.   Pt has increased difficulty with performing transfers currently and requires assistance with transfers  including in shower at home.  Pt has difficulty with pulling pants up.  She has difficulty with foot staying on pedal due to moving inward.  Pt has increased difficulty with lifting L LE on pedal and c/o's of L LE weakness.  Pt works out with a Systems analyst 3x/wk though is more limited with mobilty and with activities.    PERTINENT HISTORY: [name]--pt is in a W/C.  She requires maximum assist with transfers.  Pt required assistance with sitting.   Pt is a FALL RISK.  Use a gait belt.    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  0/10 Current and best, 6/10 worst L ankle  PRECAUTIONS: Fall and Other: Requires extensive assistance with transfers  WEIGHT BEARING RESTRICTIONS: No  FALLS:  Has patient fallen in last 6 months? Yes. Number of falls 1 with transfer  LIVING ENVIRONMENT: Lives with: lives with their family Lives in: House/apartment Stairs: 2 story home Has following equipment at home: stair lift, grab bars, Rollator  OCCUPATION: Part time job as a Runner, broadcasting/film/video.    PLOF: Needs assistance with ADLs  Family assist with ADLs.  Pt in W/C.  Pt was able to walk a few mins with her personal trainer holding her up her prior to June.  Pt able to perform transfers independently at home with grab bars and required assistance out in the community.  Pt was  able to take a few steps with rollator at home.    PATIENT GOALS: to improve strength of ankle, increase Wb'ing on L ankle, perform mobility without brace, walk a couple of mins on TM. 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  L ankle MRI on 10/02/2022: IMPRESSION: 1. Mild tenosynovitis of the peroneal tendons. 2. Ligaments of the medial and lateral ankle ligaments are intact. 3. Small ankle joint effusion. 4. No evidence of fracture or  osteonecrosis. 5. Mild edema of the Kager's fat pad. No fluid collection or hematoma.  TODAY'S TREATMENT:  PATIENT SURVEYS:   FOTO:  38 with a goal of 62 at visit #15.  FOTO 1/11                                                                                                                            Therapeutic Activity: Standing weighshift with caregiver assist at cable column (heaviest setting in order to stabilize); gait belt used and LE foot flat position manually set by PT VC and TC given for weightshift at the hip vs trunk 2x10  Standing squat with caregiver assist at bar 5x- PT anchoring L foot into foot plat  Therapeutic Exercise: Seated Ankle DF AROM 2x10 with 5s hold into DF Manually assisted ankle circles CW and CCW 2x10 each way PROM full ankle DF  supine hip ABD and ADD 2x10 Manual resisted single leg L leg press 2x10  Neuro:   NMES Russian ankle EV; 80 hz, 50% duty, 10/10 cycle, 5s ramp, 32 mA 10 mins   PATIENT EDUCATION:  Education details: anatomy, exercise progression,  muscle firing,  HEP and HEP safety, POC  Person educated:  Pt and boyfriend Education method: Explanation, Demonstration, Tactile cues, and Verbal cues Education comprehension: verbalized understanding, returned demonstration, verbal cues required, tactile cues required, and needs further education  HOME EXERCISE PROGRAM: Access Code: [email or web address] URL: [email or web address] Date: 11/14/2022 Prepared by: Daleen Bo  Exercises - Supine Ankle Circles  - 1 x daily - 7 x weekly - 2 sets - 10 reps - Supine Ankle Dorsiflexion and Plantarflexion AROM  - 1 x daily - 7 x weekly - 2 sets - 10 reps - Seated Hip Abduction with Resistance  - 1 x daily - 7 x weekly - 3 sets - 10 reps - Standing Weight Shift  - 1 x daily - 7 x weekly - 1 sets - 1 reps - 5 min total hold  ASSESSMENT:  CLINICAL IMPRESSION: Pt able to reach neutral ankle position with 8/10 tries with [place] ESTIM  assistance but lacking full eversion. Pt then able to follow up with standing WB tolerance exercise as well as globalized L LE motor control. Pt does have significant L proximal hip weakness and motor control deficits that affect ability to get to foot plate. No pain throughout session. Plan  to work towards actively taking steps with L LE without limping/knee buckling. Plan to continue revisit NMES, proximal hip stability, and L LE strength as able at future sessions.  Pt is able to reach foot flat today with much less assist from PT to anchor L foot. Pt will benefit from skilled PT services to address impairments and to improve overall function.      OBJECTIVE IMPAIRMENTS: Abnormal gait, decreased activity tolerance, decreased balance, decreased coordination, decreased endurance, decreased mobility, difficulty walking, decreased ROM, decreased strength, hypomobility, impaired flexibility, postural dysfunction, and pain.   ACTIVITY LIMITATIONS: standing, stairs, transfers, bed mobility, dressing, and locomotion level  PARTICIPATION LIMITATIONS: community activity and workout activities  PERSONAL FACTORS: 1 comorbidity: [name] and Progression of sx's and mobility deficits  are also affecting patient's functional outcome.   REHAB POTENTIAL: Fair due to Freidreich's ataxia, progression of sx's, and mobility deficits  CLINICAL DECISION MAKING: Evolving/moderate complexity  EVALUATION COMPLEXITY: Moderate   GOALS:  SHORT TERM GOALS: Target date:  11/22/2022   Pt will be able to actively perform eversion for improved ankle position and strength.  Baseline: Goal status: INITIAL  2.  Pt will demo improved positioning of L ankle in W/C based upon visual observation for improved activation of peroneals and proper length tension relationship for ankle mm for improved stability.   Baseline:  Goal status: INITIAL  3.  Pt will demo improved ankle DF AROM to neutral for improved ankle  mobility.  Baseline:  Goal status: INITIAL  4.  Pt will report at least a 25% improvement in mobility.  Baseline:  Goal status: INITIAL Target date: 11/29/2022   LONG TERM GOALS: Target date: 12/12/2022  Pt will report she is able to perform transfers at home with grab bars independently.   Baseline:  Goal status: INITIAL  2.  Pt will be able to perform transfers in the clinic with min assist.   Baseline:  Goal status: INITIAL  3.  Pt will demo improved L ankle AROM to 5-10 deg in DF and 5 deg in Eve for improved ankle ROM and positioning with mobility.  Baseline:  Goal status: INITIAL  4.  Pt will be able to tolerate 4-5 mins of standing with min assist with UE support for improved tolerance with mobility and activities around her home.  Baseline:  Goal status: INITIAL  5.  Pt will be able to tolerate minimal to moderate manual resistance in L ankle DF and Eve for improved tolerance with Wb'ing and performance of transfers and ambulation.  Baseline:  Goal status: INITIAL    PLAN:  PT FREQUENCY: 1-2x/week  PT DURATION: 6 weeks  PLANNED INTERVENTIONS: Therapeutic exercises, Therapeutic activity, Neuromuscular re-education, Balance training, Gait training, Patient/Family education, Self Care, Joint mobilization, DME instructions, Aquatic Therapy, Dry Needling, Electrical stimulation, Cryotherapy, Moist heat, Taping, Manual therapy, and Re-evaluation  PLAN FOR NEXT SESSION:  Pt is a FALL RISK.  Pt requires max assist +2 to perform transfer. Use a gait belt.  Ankle DF AROM and eve PROM.  L quad and hip strength,   Daleen Bo PT, DPT 11/23/22 1:46 PM

## 2022-11-28 ENCOUNTER — Encounter (HOSPITAL_BASED_OUTPATIENT_CLINIC_OR_DEPARTMENT_OTHER): Payer: Self-pay | Admitting: Physical Therapy

## 2022-11-28 ENCOUNTER — Ambulatory Visit (HOSPITAL_BASED_OUTPATIENT_CLINIC_OR_DEPARTMENT_OTHER): Payer: Medicare HMO | Admitting: Physical Therapy

## 2022-11-28 DIAGNOSIS — M25672 Stiffness of left ankle, not elsewhere classified: Secondary | ICD-10-CM | POA: Diagnosis not present

## 2022-11-28 DIAGNOSIS — R2689 Other abnormalities of gait and mobility: Secondary | ICD-10-CM

## 2022-11-28 DIAGNOSIS — M25572 Pain in left ankle and joints of left foot: Secondary | ICD-10-CM | POA: Diagnosis not present

## 2022-11-28 DIAGNOSIS — M6281 Muscle weakness (generalized): Secondary | ICD-10-CM

## 2022-11-28 NOTE — Therapy (Signed)
OUTPATIENT PHYSICAL THERAPY TREATMENT NOTE   Patient Name: Robin Glenn MRN: [id number] DOB:08-10-1991, 32 y.o., female Today's Date: 11/28/2022  END OF SESSION:  PT End of Session - 11/28/22 1243     Visit Number 7    Number of Visits 12    Date for PT Re-Evaluation 12/12/22    Authorization Type [employer] MCR    PT Start Time 1145    PT Stop Time 1230    PT Time Calculation (min) 45 min    Equipment Utilized During Treatment Gait belt    Activity Tolerance Patient tolerated treatment well    Behavior During Therapy WFL for tasks assessed/performed                Past Medical History:  Diagnosis Date   [name] (HCC)    History reviewed. No pertinent surgical history. 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    Pt has had no pain since last session. She states the ankle is moving around better but bearing weight with a flat foot is a tough position to get into.   Previous:   Pt denies any adverse effects after prior Rx.  Pt reports she has been performing DF AROM at home.  Pt's boyfriend states she hasn't used her rollator in 6 months.  Pt states they put weights in rollator for improved stability.   Pt has increased difficulty with performing  transfers currently and requires assistance with transfers including in shower at home.  Pt has difficulty with pulling pants up.  She has difficulty with foot staying on pedal due to moving inward.  Pt has increased difficulty with lifting L LE on pedal and c/o's of L LE weakness.  Pt works out with a Systems analyst 3x/wk though is more limited with mobilty and with activities.    PERTINENT HISTORY: [name]--pt is in a W/C.  She requires maximum assist with transfers.  Pt required assistance with sitting.   Pt is a FALL RISK.  Use a gait belt.    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  0/10 Current and best, 6/10 worst L ankle  PRECAUTIONS: Fall and Other: Requires extensive assistance with transfers  WEIGHT BEARING RESTRICTIONS: No  FALLS:  Has patient fallen in last 6 months? Yes. Number of falls 1 with transfer  LIVING ENVIRONMENT: Lives with: lives with their family Lives in: House/apartment Stairs: 2 story home Has following equipment at home: stair lift, grab bars, Rollator  OCCUPATION: Part time job as a Runner, broadcasting/film/video.    PLOF: Needs assistance with ADLs  Family assist with ADLs.  Pt in W/C.  Pt was able to walk a few mins with her personal trainer holding her up her prior to June.  Pt able to perform transfers  independently at home with grab bars and required assistance out in the community.  Pt was able to take a few steps with rollator at home.    PATIENT GOALS: to improve strength of ankle, increase Wb'ing on L ankle, perform mobility without brace, walk a couple of mins on TM. 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  L ankle MRI on 10/02/2022: IMPRESSION: 1. Mild tenosynovitis of the peroneal tendons. 2. Ligaments of the medial and lateral ankle ligaments are intact. 3.  Small ankle joint effusion. 4. No evidence of fracture or osteonecrosis. 5. Mild edema of the Kager's fat pad. No fluid collection or hematoma.  TODAY'S TREATMENT:  PATIENT SURVEYS:   FOTO:  38 with a goal of 62 at visit #15.  FOTO 1/11                                                                                                                            Therapeutic Activity: Standing weighshift with caregiver assist at cable column (heaviest setting in order to stabilize); gait belt used and LE foot flat position manually set by PT VC and TC given for weightshift at the hip vs trunk 2x10  Supine bridge 2x10 (PT anchor feet, boyfriend holding knee in alignment) Attempted steps with bilat support; maxA for trunk support, able to take 2 half steps with L and R LE, L ankle in IV throughout  Therapeutic Exercise: Seated Ankle DF AROM 2x10 with 5s hold into DF supine hip ABD and ADD 2x10 Manual resisted single leg L leg press 3x10  Neuro:   NMES Russian ankle EV; 80 hz, 50% duty, 10/10 cycle, 5s ramp, 32 mA 10 mins   PATIENT EDUCATION:  Education details: anatomy, exercise progression,  muscle firing,  HEP and HEP safety, POC  Person educated:  Pt and boyfriend Education method: Explanation, Demonstration, Tactile cues, and Verbal cues Education comprehension: verbalized understanding, returned demonstration, verbal cues required, tactile cues required, and needs further education  HOME EXERCISE PROGRAM: Access Code: [email or web address] URL: [email or web address] Date: 11/14/2022 Prepared by: Daleen Bo  Exercises - Supine Ankle Circles  - 1 x daily - 7 x weekly - 2 sets - 10 reps - Supine Ankle Dorsiflexion and Plantarflexion AROM  - 1 x daily - 7 x weekly - 2 sets - 10 reps - Seated Hip Abduction with Resistance  - 1 x daily - 7 x weekly - 3 sets - 10 reps - Standing Weight Shift  - 1 x daily - 7 x weekly - 1 sets - 1 reps - 5 min total  hold  ASSESSMENT:  CLINICAL IMPRESSION: Pt with much better control of the L proximal hip at today's session and able to maintain better alignment of the lower quarter with strengthening exercise. Pt can get L ankle EV with and without E-stim assist but does not  have the strength to control the L ankle once in WB positions. Pt was able to increase volume of exercise today, but with noticeable fatigue of the hip and ankle, though pt reports not being able to tell. Pt with lack of proprioceptive input, requires visual confirmation of positions. Plan to start aquatic at next session to work on LE strength and gait. Pt will benefit from skilled PT services to address impairments and to improve overall function.      OBJECTIVE IMPAIRMENTS: Abnormal gait, decreased activity tolerance, decreased balance, decreased coordination, decreased endurance, decreased mobility, difficulty walking, decreased ROM, decreased strength, hypomobility, impaired flexibility, postural dysfunction, and pain.   ACTIVITY LIMITATIONS: standing, stairs, transfers, bed mobility, dressing, and locomotion level  PARTICIPATION LIMITATIONS: community activity and workout activities  PERSONAL FACTORS: 1 comorbidity: [name] and Progression of sx's and mobility deficits  are also affecting patient's functional outcome.   REHAB POTENTIAL: Fair due to Freidreich's ataxia, progression of sx's, and mobility deficits  CLINICAL DECISION MAKING: Evolving/moderate complexity  EVALUATION COMPLEXITY: Moderate   GOALS:  SHORT TERM GOALS: Target date:  11/22/2022   Pt will be able to actively perform eversion for improved ankle position and strength.  Baseline: Goal status: INITIAL  2.  Pt will demo improved positioning of L ankle in W/C based upon visual observation for improved activation of peroneals and proper length tension relationship for ankle mm for improved stability.   Baseline:  Goal status: INITIAL  3.  Pt  will demo improved ankle DF AROM to neutral for improved ankle mobility.  Baseline:  Goal status: INITIAL  4.  Pt will report at least a 25% improvement in mobility.  Baseline:  Goal status: INITIAL Target date: 11/29/2022   LONG TERM GOALS: Target date: 12/12/2022  Pt will report she is able to perform transfers at home with grab bars independently.   Baseline:  Goal status: INITIAL  2.  Pt will be able to perform transfers in the clinic with min assist.   Baseline:  Goal status: INITIAL  3.  Pt will demo improved L ankle AROM to 5-10 deg in DF and 5 deg in Eve for improved ankle ROM and positioning with mobility.  Baseline:  Goal status: INITIAL  4.  Pt will be able to tolerate 4-5 mins of standing with min assist with UE support for improved tolerance with mobility and activities around her home.  Baseline:  Goal status: INITIAL  5.  Pt will be able to tolerate minimal to moderate manual resistance in L ankle DF and Eve for improved tolerance with Wb'ing and performance of transfers and ambulation.  Baseline:  Goal status: INITIAL    PLAN:  PT FREQUENCY: 1-2x/week  PT DURATION: 6 weeks  PLANNED INTERVENTIONS: Therapeutic exercises, Therapeutic activity, Neuromuscular re-education, Balance training, Gait training, Patient/Family education, Self Care, Joint mobilization, DME instructions, Aquatic Therapy, Dry Needling, Electrical stimulation, Cryotherapy, Moist heat, Taping, Manual therapy, and Re-evaluation  PLAN FOR NEXT SESSION:  Pt is a FALL RISK.  Pt requires max assist +2 to perform transfer. Use a gait belt.  Ankle DF AROM and eve PROM.  L quad and hip strength; use lift at pool   Daleen Bo PT, DPT 11/28/22 12:47 PM

## 2022-11-30 ENCOUNTER — Encounter (HOSPITAL_BASED_OUTPATIENT_CLINIC_OR_DEPARTMENT_OTHER): Payer: Medicare HMO | Admitting: Physical Therapy

## 2022-11-30 ENCOUNTER — Ambulatory Visit (HOSPITAL_BASED_OUTPATIENT_CLINIC_OR_DEPARTMENT_OTHER): Payer: Medicare HMO | Admitting: Physical Therapy

## 2022-11-30 ENCOUNTER — Encounter (HOSPITAL_BASED_OUTPATIENT_CLINIC_OR_DEPARTMENT_OTHER): Payer: Self-pay | Admitting: Physical Therapy

## 2022-11-30 DIAGNOSIS — R2689 Other abnormalities of gait and mobility: Secondary | ICD-10-CM

## 2022-11-30 DIAGNOSIS — M6281 Muscle weakness (generalized): Secondary | ICD-10-CM | POA: Diagnosis not present

## 2022-11-30 DIAGNOSIS — M25672 Stiffness of left ankle, not elsewhere classified: Secondary | ICD-10-CM

## 2022-11-30 DIAGNOSIS — M25572 Pain in left ankle and joints of left foot: Secondary | ICD-10-CM | POA: Diagnosis not present

## 2022-11-30 NOTE — Therapy (Signed)
OUTPATIENT PHYSICAL THERAPY TREATMENT NOTE   Patient Name: Robin Glenn MRN: [id number] DOB:June 21, 1991, 32 y.o., female Today's Date: 11/30/2022  END OF SESSION:  PT End of Session - 11/30/22 1204     Visit Number 8    Number of Visits 12    Date for PT Re-Evaluation 12/12/22    Authorization Type AETNA MCR    PT Start Time 2706    PT Stop Time 1245    PT Time Calculation (min) 40 min    Equipment Utilized During Treatment Gait belt    Activity Tolerance Patient tolerated treatment well    Behavior During Therapy WFL for tasks assessed/performed                Past Medical History:  Diagnosis Date   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 No pertinent surgical history. 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    Pt reports being excited to begin aquatic therapy. Previous:   Pt denies any adverse effects after prior Rx.  Pt reports she has been performing DF AROM at home.  Pt's boyfriend states she hasn't used her rollator in 6 months.  Pt states they put weights in rollator for improved stability.   Pt has increased difficulty with performing transfers currently and requires assistance with transfers including in shower at home.  Pt has difficulty  with pulling pants up.  She has difficulty with foot staying on pedal due to moving inward.  Pt has increased difficulty with lifting L LE on pedal and c/o's of L LE weakness.  Pt works out with a Physiological scientist 3x/wk though is more limited with mobilty and with activities.    PERTINENT HISTORY: [name]--pt is in a W/C.  She requires maximum assist with transfers.  Pt required assistance with sitting.   Pt is a FALL RISK.  Use a gait belt.    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  0/10 Current and best, 6/10 worst L ankle  PRECAUTIONS: Fall and Other: Requires extensive assistance with transfers  WEIGHT BEARING RESTRICTIONS: No  FALLS:  Has patient fallen in last 6 months? Yes. Number of falls 1 with transfer  LIVING ENVIRONMENT: Lives with: lives with their family Lives in: House/apartment Stairs: 2 story home Has following equipment at home: stair lift, grab bars, Rollator  OCCUPATION: Part time job as a Sales promotion account executive.    PLOF: Needs assistance with ADLs  Family assist with ADLs.  Pt in W/C.  Pt was able to walk a few mins with her personal trainer holding her up her prior to June.  Pt able to perform transfers independently at home with grab bars and required assistance out in the community.  Pt was able to take a few steps with rollator  at home.    PATIENT GOALS: to improve strength of ankle, increase Wb'ing on L ankle, perform mobility without brace, walk a couple of mins on TM. 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  L ankle MRI on 10/02/2022: IMPRESSION: 1. Mild tenosynovitis of the peroneal tendons. 2. Ligaments of the medial and lateral ankle ligaments are intact. 3. Small ankle joint effusion. 4. No evidence of fracture or osteonecrosis. 5. Mild edema of the Kager's fat  pad. No fluid collection or hematoma.  TODAY'S TREATMENT: Aquatic: Pt seen for aquatic therapy today.  Treatment took place in water 3.25-4.5 ft in depth at the [facility] pool. Temp of water was 91.  Pt entered/exited the pool via lift. Boy friend assisting.  Max assist to and from wc and lift (boyfriend).  Seated: LAQ warm up *water walker: amb/gait training in 4.0 through 4.80ft (pt unable to stand in 3.6 ft without max assist for erect posture). 5 Lb weigth lle; 2.5lb weight rle VC and manual assist for LE control.  *standing 4 ft with therapist manually assisting lle foot flat on bottom of pool and manual added wb through pt's hips for weight shifting R/L *standing ue support on wall 3.6 ft. Manual asst to stand blocking lle from hip flex, cues for upright position pt tending to shift weight right *suspended supine: using yellow noodles: knees to chest; sie bending for core strengthening.  Pt requires the buoyancy and hydrostatic pressure of water for support, and to offload joints by unweighting joint load by at least 50 % in navel deep water and by at least 75-80% in chest to neck deep water.  Viscosity of the water is needed for resistance of strengthening. Water current perturbations provides challenge to standing balance requiring increased core activation.      Prior visit land based: PATIENT SURVEYS:   FOTO:  38 with a goal of 62 at visit #15.  FOTO 1/11                                                                                                                            Therapeutic Activity: Standing weighshift with caregiver assist at cable column (heaviest setting in order to stabilize); gait belt used and LE foot flat position manually set by PT VC and TC given for weightshift at the hip vs trunk 2x10  Supine bridge 2x10 (PT anchor feet, boyfriend holding knee in alignment) Attempted steps with bilat support; maxA for trunk support, able to take 2 half  steps with L and R LE, L ankle in IV throughout  Therapeutic Exercise: Seated Ankle DF AROM 2x10 with 5s hold into DF supine hip ABD and ADD 2x10 Manual resisted single leg L leg press 3x10  Neuro:   NMES Russian ankle EV; 80 hz, 50% duty, 10/10 cycle, 5s ramp, 32 mA 10 mins  PATIENT EDUCATION:  Education details: anatomy, exercise progression,  muscle firing,  HEP and HEP safety, POC  Person educated:  Pt and boyfriend Education method: Explanation, Demonstration, Tactile cues, and Verbal cues Education comprehension: verbalized understanding, returned demonstration, verbal cues required, tactile cues required, and needs further education  HOME EXERCISE PROGRAM: Access Code: [email or web address] URL: [email or web address] Date: 11/14/2022 Prepared by: Zebedee Iba  Exercises - Supine Ankle Circles  - 1 x daily - 7 x weekly - 2 sets - 10 reps - Supine Ankle Dorsiflexion and Plantarflexion AROM  - 1 x daily - 7 x weekly - 2 sets - 10 reps - Seated Hip Abduction with Resistance  - 1 x daily - 7 x weekly - 3 sets - 10 reps - Standing Weight Shift  - 1 x daily - 7 x weekly - 1 sets - 1 reps - 5 min total hold  ASSESSMENT:  CLINICAL IMPRESSION: 1st aquatic session.  Needed to weight LE to keep le submerged/bottom of pool.  Pt with difficulty standing using water walker or supported on wall in 3.6 ft due to amount of assistance needed to align positioning in erect posture.  Some improvements in deeper water but then increased difficulty maintaining LE submerged and in contact with floor. Was able to encourage some reciprocal LE movements using water walker with assistance of Robin Glenn her boyfriend. Due to lack of proprioceptive input and inability to watch LE in setting (impeded by water and walker) she has poor control of le's submerged.  We were able to work core strength.  Will continue with strengthening of le and gait training as tolerated.          OBJECTIVE IMPAIRMENTS: Abnormal  gait, decreased activity tolerance, decreased balance, decreased coordination, decreased endurance, decreased mobility, difficulty walking, decreased ROM, decreased strength, hypomobility, impaired flexibility, postural dysfunction, and pain.   ACTIVITY LIMITATIONS: standing, stairs, transfers, bed mobility, dressing, and locomotion level  PARTICIPATION LIMITATIONS: community activity and workout activities  PERSONAL FACTORS: 1 comorbidity: [name] and Progression of sx's and mobility deficits  are also affecting patient's functional outcome.   REHAB POTENTIAL: Fair due to Freidreich's ataxia, progression of sx's, and mobility deficits  CLINICAL DECISION MAKING: Evolving/moderate complexity  EVALUATION COMPLEXITY: Moderate   GOALS:  SHORT TERM GOALS: Target date:  11/22/2022   Pt will be able to actively perform eversion for improved ankle position and strength.  Baseline: Goal status: INITIAL  2.  Pt will demo improved positioning of L ankle in W/C based upon visual observation for improved activation of peroneals and proper length tension relationship for ankle mm for improved stability.   Baseline:  Goal status: INITIAL  3.  Pt will demo improved ankle DF AROM to neutral for improved ankle mobility.  Baseline:  Goal status: INITIAL  4.  Pt will report at least a 25% improvement in mobility.  Baseline:  Goal status: INITIAL Target date: 11/29/2022   LONG TERM GOALS: Target date: 12/12/2022  Pt will report she is able to perform transfers at home with grab bars independently.   Baseline:  Goal status: INITIAL  2.  Pt will be able to perform transfers in the clinic with min assist.   Baseline:  Goal status: INITIAL  3.  Pt will demo improved L ankle AROM to 5-10 deg in DF and 5 deg in Eve for improved ankle ROM and positioning with mobility.  Baseline:  Goal status: INITIAL  4.  Pt will be able to tolerate 4-5 mins of standing  with min assist with UE  support for improved tolerance with mobility and activities around her home.  Baseline:  Goal status: INITIAL  5.  Pt will be able to tolerate minimal to moderate manual resistance in L ankle DF and Eve for improved tolerance with Wb'ing and performance of transfers and ambulation.  Baseline:  Goal status: INITIAL    PLAN:  PT FREQUENCY: 1-2x/week  PT DURATION: 6 weeks  PLANNED INTERVENTIONS: Therapeutic exercises, Therapeutic activity, Neuromuscular re-education, Balance training, Gait training, Patient/Family education, Self Care, Joint mobilization, DME instructions, Aquatic Therapy, Dry Needling, Electrical stimulation, Cryotherapy, Moist heat, Taping, Manual therapy, and Re-evaluation  PLAN FOR NEXT SESSION:  Pt is a FALL RISK.  Pt requires max assist +2 to perform transfer. Use a gait belt.  Ankle DF AROM and eve PROM.  L quad and hip strength; use lift at pool   [facility]) Jackelyne Sayer MPT 11/30/22 116p

## 2022-12-04 ENCOUNTER — Telehealth: Payer: Self-pay

## 2022-12-04 NOTE — Telephone Encounter (Signed)
Received fax from [facility] stating pt is enrolled in patient assistance for [facility] until 11/06/2023. Pt ID [id number].

## 2022-12-05 ENCOUNTER — Encounter (HOSPITAL_BASED_OUTPATIENT_CLINIC_OR_DEPARTMENT_OTHER): Payer: Self-pay | Admitting: Physical Therapy

## 2022-12-05 ENCOUNTER — Ambulatory Visit (HOSPITAL_BASED_OUTPATIENT_CLINIC_OR_DEPARTMENT_OTHER): Payer: Medicare HMO | Admitting: Physical Therapy

## 2022-12-05 DIAGNOSIS — R2689 Other abnormalities of gait and mobility: Secondary | ICD-10-CM

## 2022-12-05 DIAGNOSIS — M25672 Stiffness of left ankle, not elsewhere classified: Secondary | ICD-10-CM | POA: Diagnosis not present

## 2022-12-05 DIAGNOSIS — M25572 Pain in left ankle and joints of left foot: Secondary | ICD-10-CM | POA: Diagnosis not present

## 2022-12-05 DIAGNOSIS — M6281 Muscle weakness (generalized): Secondary | ICD-10-CM

## 2022-12-05 NOTE — Therapy (Addendum)
OUTPATIENT PHYSICAL THERAPY TREATMENT NOTE  Progress Note Reporting Period 10/31/22 to 12/05/22  See note below for Objective Data and Assessment of Progress/Goals.       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 No pertinent surgical history. 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    Pt states she had difficulty with her first aquatic session but still wants to try to continue working towards aquatic. She has had pain in her calf  Previous:   Pt denies any adverse effects after prior Rx.  Pt reports she has been performing DF AROM at home.  Pt's boyfriend states she hasn't used her  rollator in 6 months.  Pt states they put weights in rollator for improved stability.   Pt has increased difficulty with performing transfers currently and requires assistance with transfers including in shower at home.  Pt has difficulty with pulling pants up.  She has difficulty with foot staying on pedal due to moving inward.  Pt has increased difficulty with lifting L LE on pedal and c/o's of L LE weakness.  Pt works out with a Physiological scientist 3x/wk though is more limited with mobilty and with activities.    PERTINENT HISTORY: [name]--pt is in a W/C.  She requires maximum assist with transfers.  Pt required assistance with sitting.   Pt is a FALL RISK.  Use a gait belt.    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  0/10 Current and best, 6/10 worst L ankle  PRECAUTIONS: Fall and Other: Requires extensive assistance with transfers  WEIGHT BEARING RESTRICTIONS: No  FALLS:  Has patient fallen in last 6 months? Yes. Number of falls 1 with transfer  LIVING ENVIRONMENT: Lives with: lives with their family Lives in: House/apartment Stairs: 2 story home Has following equipment at home: stair lift, grab bars, Rollator  OCCUPATION: Part time job as a Sales promotion account executive.    PLOF: Needs assistance with ADLs  Family assist with ADLs.  Pt in W/C.  Pt was able to walk  a few mins with her personal trainer holding her up her prior to June.  Pt able to perform transfers independently at home with grab bars and required assistance out in the community.  Pt was able to take a few steps with rollator at home.    PATIENT GOALS: to improve strength of ankle, increase Wb'ing on L ankle, perform mobility without brace, walk a couple of mins on TM. 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  L ankle MRI on  10/02/2022: IMPRESSION: 1. Mild tenosynovitis of the peroneal tendons. 2. Ligaments of the medial and lateral ankle ligaments are intact. 3. Small ankle joint effusion. 4. No evidence of fracture or osteonecrosis. 5. Mild edema of the Kager's fat pad. No fluid collection or hematoma.  POSTURE: Pt sits in W/C with L ankle in very mild inversion and PF on pedal.  Pt's L foot able to reach foot flat in standing       LOWER EXTREMITY ROM:    ROM Right eval Left eval  Hip flexion      Hip extension      Hip abduction      Hip adduction      Hip internal rotation      Hip external rotation      Knee flexion      Knee extension      Ankle dorsiflexion   0  Ankle plantarflexion      Ankle inversion   30  Ankle eversion   5 deg with multiple attempts   (Blank rows = not tested)   LOWER EXTREMITY MMT:   MMT Right eval Left eval  Ankle eversion   3/5   (Blank rows = not tested)       FUNCTIONAL TESTS:  Pt required maximum assistance from boyfriend to perform transfer from W/C to table.   -MaxA of 1 for STS transfer  -able to stand with foot flat position when supported by caregiver   TODAY'S TREATMENT:  1/23 Boyfriend available during session for dependent transfers to mat table   Manually assisted gastroc stretching for L LE  Ankle isometrics 3s 10x DF, IV, EV S/L clam with assist and without 3x5 and then 2x10 as contraction improved L LE leg manually resisted leg press 3x10;  Supine bridge with ADD 2x10 Ball ADD squeeze 3x10 Supine half turns/half bridges to simulate rolling in bed position 10x   Aquatic: Pt seen for aquatic therapy today.  Treatment took place in water 3.25-4.5 ft in depth at the [facility]. Temp of water was 91.  Pt entered/exited the pool via lift. Boy friend assisting.  Max assist to and from wc and lift (boyfriend).  Seated: LAQ warm up *water walker: amb/gait training in 4.0 through 4.74f (pt unable to stand in 3.6 ft  without max assist for erect posture). 5 Lb weigth lle; 2.5lb weight rle VC and manual assist for LE control.  *standing 4 ft with therapist manually assisting lle foot flat on bottom of pool and manual added wb through pt's hips for weight shifting R/L *standing ue support on wall 3.6 ft. Manual asst to stand blocking lle from hip flex, cues for upright position pt tending to shift weight right *suspended supine: using yellow noodles: knees to chest; sie bending for core strengthening.  Pt requires the buoyancy and hydrostatic pressure of water for support, and  to offload joints by unweighting joint load by at least 50 % in navel deep water and by at least 75-80% in chest to neck deep water.  Viscosity of the water is needed for resistance of strengthening. Water current perturbations provides challenge to standing balance requiring increased core activation.      Prior visit land based: PATIENT SURVEYS:   FOTO:  38 with a goal of 62 at visit #15.  FOTO 1/30: 49 pts                                                                                                                             Therapeutic Activity: Standing weighshift with caregiver assist at cable column (heaviest setting in order to stabilize); gait belt used and LE foot flat position manually set by PT VC and TC given for weightshift at the hip vs trunk 2x10  Supine bridge 2x10 (PT anchor feet, boyfriend holding knee in alignment) Attempted steps with bilat support; maxA for trunk support, able to take 2 half steps with L and R LE, L ankle in IV throughout  Therapeutic Exercise: Seated Ankle DF AROM 2x10 with 5s hold into DF supine hip ABD and ADD 2x10 Manual resisted single leg L leg press 3x10  Neuro:   NMES Russian ankle EV; 80 hz, 50% duty, 10/10 cycle, 5s ramp, 32 mA 10 mins   PATIENT EDUCATION:  Education details: anatomy, exercise progression,  muscle firing,  HEP and HEP safety, POC  Person educated:  Pt  and boyfriend Education method: Explanation, Demonstration, Tactile cues, and Verbal cues Education comprehension: verbalized understanding, returned demonstration, verbal cues required, tactile cues required, and needs further education  HOME EXERCISE PROGRAM: Access Code: [email or web address] URL: [email or web address] Date: 11/14/2022 Prepared by: Daleen Bo  Exercises - Supine Ankle Circles  - 1 x daily - 7 x weekly - 2 sets - 10 reps - Supine Ankle Dorsiflexion and Plantarflexion AROM  - 1 x daily - 7 x weekly - 2 sets - 10 reps - Seated Hip Abduction with Resistance  - 1 x daily - 7 x weekly - 3 sets - 10 reps - Standing Weight Shift  - 1 x daily - 7 x weekly - 1 sets - 1 reps - 5 min total hold  ASSESSMENT:  CLINICAL IMPRESSION: Pt able to continue making progress with motor control of the L proximal hip and ankle. Pt is able to perform AROM eversion as well as reach foot flat position with standing. Pt requires less TC and assistance for LE motion and is improving overall control, though standing and taking steps continue to be difficult due to her ataxia. Pt is able to perform light resisted ankle inversion and perform more L LE WB in supine. Pt does have expected motor control deficits with inability to grade strength of contraction. Pt to continue working with therapy on progressing towards standing and taking steps. Pt to have 2nd aquatic session at next visit. Plan  to continue with trunk, hip, and ankle control. Pt will benefit from skilled PT services to address impairments and to improve overall function.            OBJECTIVE IMPAIRMENTS: Abnormal gait, decreased activity tolerance, decreased balance, decreased coordination, decreased endurance, decreased mobility, difficulty walking, decreased ROM, decreased strength, hypomobility, impaired flexibility, postural dysfunction, and pain.   ACTIVITY LIMITATIONS: standing, stairs, transfers, bed mobility, dressing, and locomotion  level  PARTICIPATION LIMITATIONS: community activity and workout activities  PERSONAL FACTORS: 1 comorbidity: [name] and Progression of sx's and mobility deficits  are also affecting patient's functional outcome.   REHAB POTENTIAL: Fair due to Freidreich's ataxia, progression of sx's, and mobility deficits  CLINICAL DECISION MAKING: Evolving/moderate complexity  EVALUATION COMPLEXITY: Moderate   GOALS:  SHORT TERM GOALS: Target date:  11/22/2022   Pt will be able to actively perform eversion for improved ankle position and strength.  Baseline: Goal status: MET  2.  Pt will demo improved positioning of L ankle in W/C based upon visual observation for improved activation of peroneals and proper length tension relationship for ankle mm for improved stability.   Baseline:  Goal status: MET  3.  Pt will demo improved ankle DF AROM to neutral for improved ankle mobility.  Baseline:  Goal status: MET  4.  Pt will report at least a 25% improvement in mobility.  Baseline:  Goal status: MET Target date: 11/29/2022   LONG TERM GOALS: Target date: 02/27/23  Pt will report she is able to perform transfers at home with grab bars independently.   Baseline:  Goal status: ongoing  2.  Pt will be able to perform transfers in the clinic with min assist.   Baseline:  Goal status: ongoing  3.  Pt will demo improved L ankle AROM to 5-10 deg in DF and 5 deg in Eve for improved ankle ROM and positioning with mobility.  Baseline:  Goal status: ongoing  4.  Pt will be able to tolerate 4-5 mins of standing with min assist with UE support for improved tolerance with mobility and activities around her home.  Baseline:  Goal status: ongoing  5.  Pt will be able to tolerate minimal to moderate manual resistance in L ankle DF and Eve for improved tolerance with Wb'ing and performance of transfers and ambulation.  Baseline:  Goal status: ongoing    PLAN:  PT FREQUENCY:  1-2x/week  PT DURATION: 12 wks (likely DC in 10)  PLANNED INTERVENTIONS: Therapeutic exercises, Therapeutic activity, Neuromuscular re-education, Balance training, Gait training, Patient/Family education, Self Care, Joint mobilization, DME instructions, Aquatic Therapy, Dry Needling, Electrical stimulation, Cryotherapy, Moist heat, Taping, Manual therapy, and Re-evaluation  PLAN FOR NEXT SESSION:  Pt is a FALL RISK.  Pt requires max assist +2 to perform transfer. Use a gait belt.  Ankle DF AROM and eve PROM.  L quad and hip strength; use lift at pool   Daleen Bo PT, DPT 12/05/22 12:44 PM

## 2022-12-07 ENCOUNTER — Encounter (HOSPITAL_BASED_OUTPATIENT_CLINIC_OR_DEPARTMENT_OTHER): Payer: Self-pay | Admitting: Physical Therapy

## 2022-12-07 ENCOUNTER — Ambulatory Visit (HOSPITAL_BASED_OUTPATIENT_CLINIC_OR_DEPARTMENT_OTHER): Payer: Medicare HMO | Admitting: Physical Therapy

## 2022-12-07 ENCOUNTER — Encounter (HOSPITAL_BASED_OUTPATIENT_CLINIC_OR_DEPARTMENT_OTHER): Payer: Medicare HMO | Admitting: Physical Therapy

## 2022-12-07 DIAGNOSIS — R2689 Other abnormalities of gait and mobility: Secondary | ICD-10-CM | POA: Diagnosis not present

## 2022-12-07 DIAGNOSIS — M25672 Stiffness of left ankle, not elsewhere classified: Secondary | ICD-10-CM | POA: Diagnosis not present

## 2022-12-07 DIAGNOSIS — M25572 Pain in left ankle and joints of left foot: Secondary | ICD-10-CM | POA: Diagnosis not present

## 2022-12-07 DIAGNOSIS — M6281 Muscle weakness (generalized): Secondary | ICD-10-CM | POA: Diagnosis not present

## 2022-12-07 NOTE — Therapy (Signed)
OUTPATIENT PHYSICAL THERAPY TREATMENT NOTE   Patient Name: Robin Glenn MRN: [id number] DOB:[birth year], 32 y.o., female Today's Date: 12/07/2022  END OF SESSION:  PT End of Session - 12/07/22 1729     Visit Number 10    Number of Visits 12    Date for PT Re-Evaluation 12/12/22    Authorization Type AET[employer] MCR    PT Start Time 1402    PT Stop Time 1455    PT Time Calculation (min) 53 min    Equipment Utilized During Treatment Gait belt    Activity Tolerance Patient tolerated treatment well    Behavior During Therapy WFL for tasks assessed/performed                 Past Medical History:  Diagnosis Date   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 No pertinent surgical history. 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    Pt reports pain after land based therapy in ankle but she is expecting that.  Previous:   Pt denies any adverse effects after prior Rx.  Pt reports she has been performing DF AROM at home.  Pt's boyfriend states she hasn't used her rollator in 6 months.  Pt states they put weights in rollator for improved stability.   Pt has increased difficulty with performing transfers currently and requires assistance with transfers including in  shower at home.  Pt has difficulty with pulling pants up.  She has difficulty with foot staying on pedal due to moving inward.  Pt has increased difficulty with lifting L LE on pedal and c/o's of L LE weakness.  Pt works out with a Physiological scientist 3x/wk though is more limited with mobilty and with activities.    PERTINENT HISTORY: [name]--pt is in a W/C.  She requires maximum assist with transfers.  Pt required assistance with sitting.   Pt is a FALL RISK.  Use a gait belt.    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  0/10 Current and best, 6/10 worst L ankle  PRECAUTIONS: Fall and Other: Requires extensive assistance with transfers  WEIGHT BEARING RESTRICTIONS: No  FALLS:  Has patient fallen in last 6 months? Yes. Number of falls 1 with transfer  LIVING ENVIRONMENT: Lives with: lives with their family Lives in: House/apartment Stairs: 2 story home Has following equipment at home: stair lift, grab bars, Rollator  OCCUPATION: Part time job as a Sales promotion account executive.    PLOF: Needs assistance with ADLs  Family assist with ADLs.  Pt in W/C.  Pt was able to walk a few mins with her personal trainer holding her up her prior to June.  Pt able to perform transfers independently at home with grab bars and required assistance out in the community.  Pt was  able to take a few steps with rollator at home.    PATIENT GOALS: to improve strength of ankle, increase Wb'ing on L ankle, perform mobility without brace, walk a couple of mins on TM. 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  L ankle MRI on 10/02/2022: IMPRESSION: 1. Mild tenosynovitis of the peroneal tendons. 2. Ligaments of the medial and lateral ankle ligaments are intact. 3. Small ankle joint effusion. 4. No evidence of fracture or  osteonecrosis. 5. Mild edema of the Kager's fat pad. No fluid collection or hematoma.  TODAY'S TREATMENT: Aquatic: Pt seen for aquatic therapy today.  Treatment took place in water 3.25-4.5 ft in depth at the [facility] pool. Temp of water was 91.  Pt entered/exited the pool via lift. Boy friend assisting.  Max assist to and from wc and lift (boyfriend).  Seated *water walker:  7.5 Lb weigth lle; 5 lb weight rle. Standing in 4.5 ft then decreasing to 4.0 ft -with assistance of Lance (BF) pt standing in each depth weight bearing through bilat LE. Weight shifting. Pt requires max assist to gain and sustain position.   -therapist manually assisting lle foot flat on bottom of pool and manual added wb through pt's hips for weight shifting R/L *Suspended with yellow noodle wrapped posteriorly under arms.  Pt cues on gaining and maintaining head above water/ balance with movement of LE submerged as able *suspended supine: supported by Micah Noel: hip add/abd; knee flex/ex; hip extension. Requires assist with add lle to neurtral.  If given extra time pt able to complete ~50 of range *suspended prone: cues for hip add/abd and kicking.  Pt requires the buoyancy and hydrostatic pressure of water for support, and to offload joints by unweighting joint load by at least 50 % in navel deep water and by at least 75-80% in chest to neck deep water.  Viscosity of the water is needed for resistance of strengthening. Water current perturbations provides challenge to standing balance requiring increased core activation.    1/23 Boyfriend available during session for dependent transfers to mat table   Manually assisted gastroc stretching for L LE  Ankle isometrics 3s 10x DF, IV, EV S/L clam with assist and without 3x5 and then 2x10 as contraction improved L LE leg manually resisted leg press 3x10;  Supine bridge with ADD 2x10 Ball ADD squeeze 3x10 Supine half turns/half bridges to simulate rolling in  bed position 10x    Prior visit land based: PATIENT SURVEYS:   FOTO:  38 with a goal of 62 at visit #15.  FOTO 1/11                                                                                                                            Therapeutic Activity: Standing weighshift with caregiver assist at cable column (heaviest setting in order to  stabilize); gait belt used and LE foot flat position manually set by PT VC and TC given for weightshift at the hip vs trunk 2x10  Supine bridge 2x10 (PT anchor feet, boyfriend holding knee in alignment) Attempted steps with bilat support; maxA for trunk support, able to take 2 half steps with L and R LE, L ankle in IV throughout  Therapeutic Exercise: Seated Ankle DF AROM 2x10 with 5s hold into DF supine hip ABD and ADD 2x10 Manual resisted single leg L leg press 3x10  Neuro:   NMES Russian ankle EV; 80 hz, 50% duty, 10/10 cycle, 5s ramp, 32 mA 10 mins   PATIENT EDUCATION:  Education details: anatomy, exercise progression,  muscle firing,  HEP and HEP safety, POC  Person educated:  Pt and boyfriend Education method: Explanation, Demonstration, Tactile cues, and Verbal cues Education comprehension: verbalized understanding, returned demonstration, verbal cues required, tactile cues required, and needs further education  HOME EXERCISE PROGRAM: Access Code: [email or web address] URL: [email or web address] Date: 11/14/2022 Prepared by: Zebedee Iba  Exercises - Supine Ankle Circles  - 1 x daily - 7 x weekly - 2 sets - 10 reps - Supine Ankle Dorsiflexion and Plantarflexion AROM  - 1 x daily - 7 x weekly - 2 sets - 10 reps - Seated Hip Abduction with Resistance  - 1 x daily - 7 x weekly - 3 sets - 10 reps - Standing Weight Shift  - 1 x daily - 7 x weekly - 1 sets - 1 reps - 5 min total hold  ASSESSMENT:  CLINICAL IMPRESSION: Pt working today to gain motor control of left hip and knee in various positions submerged.  She has greater  success today with some decreased apprehension.  Micah Noel provide added confidence for her in the water. Weighted lle more today which assisted with gaining vertical position and with maneuvering with +2 assist we were able to gain weight bearing through lle effectively.  She does need assistance with left foot flat and knee extension.  Once knee 'Locked' into extension she was able to hold position multiple times for ~ 2 minutes.  Also worked on pt maintaining indep position floating using yellow foam noodle to encourage core firing and increased confidence in setting.            OBJECTIVE IMPAIRMENTS: Abnormal gait, decreased activity tolerance, decreased balance, decreased coordination, decreased endurance, decreased mobility, difficulty walking, decreased ROM, decreased strength, hypomobility, impaired flexibility, postural dysfunction, and pain.   ACTIVITY LIMITATIONS: standing, stairs, transfers, bed mobility, dressing, and locomotion level  PARTICIPATION LIMITATIONS: community activity and workout activities  PERSONAL FACTORS: 1 comorbidity: [name] and Progression of sx's and mobility deficits  are also affecting patient's functional outcome.   REHAB POTENTIAL: Fair due to Freidreich's ataxia, progression of sx's, and mobility deficits  CLINICAL DECISION MAKING: Evolving/moderate complexity  EVALUATION COMPLEXITY: Moderate   GOALS:  SHORT TERM GOALS: Target date:  11/22/2022   Pt will be able to actively perform eversion for improved ankle position and strength.  Baseline: Goal status: INITIAL  2.  Pt will demo improved positioning of L ankle in W/C based upon visual observation for improved activation of peroneals and proper length tension relationship for ankle mm for improved stability.   Baseline:  Goal status: INITIAL  3.  Pt will demo improved ankle DF AROM to neutral for improved ankle mobility.  Baseline:  Goal status: INITIAL  4.  Pt will report at least  a 25% improvement in mobility.  Baseline:  Goal status: INITIAL Target date: 11/29/2022   LONG TERM GOALS: Target date: 12/12/2022  Pt will report she is able to perform transfers at home with grab bars independently.   Baseline:  Goal status: INITIAL  2.  Pt will be able to perform transfers in the clinic with min assist.   Baseline:  Goal status: INITIAL  3.  Pt will demo improved L ankle AROM to 5-10 deg in DF and 5 deg in Eve for improved ankle ROM and positioning with mobility.  Baseline:  Goal status: INITIAL  4.  Pt will be able to tolerate 4-5 mins of standing with min assist with UE support for improved tolerance with mobility and activities around her home.  Baseline:  Goal status: INITIAL  5.  Pt will be able to tolerate minimal to moderate manual resistance in L ankle DF and Eve for improved tolerance with Wb'ing and performance of transfers and ambulation.  Baseline:  Goal status: INITIAL    PLAN:  PT FREQUENCY: 1-2x/week  PT DURATION: 6 weeks  PLANNED INTERVENTIONS: Therapeutic exercises, Therapeutic activity, Neuromuscular re-education, Balance training, Gait training, Patient/Family education, Self Care, Joint mobilization, DME instructions, Aquatic Therapy, Dry Needling, Electrical stimulation, Cryotherapy, Moist heat, Taping, Manual therapy, and Re-evaluation  PLAN FOR NEXT SESSION:  Pt is a FALL RISK.  Pt requires max assist +2 to perform transfer. Use a gait belt.  Ankle DF AROM and eve PROM.  L quad and hip strength; use lift at pool   [facility]) Lenoir Facchini MPT 12/07/22 5:30 PM

## 2022-12-12 ENCOUNTER — Encounter (HOSPITAL_BASED_OUTPATIENT_CLINIC_OR_DEPARTMENT_OTHER): Payer: Self-pay | Admitting: Physical Therapy

## 2022-12-12 ENCOUNTER — Ambulatory Visit (HOSPITAL_BASED_OUTPATIENT_CLINIC_OR_DEPARTMENT_OTHER): Payer: Medicare HMO | Admitting: Physical Therapy

## 2022-12-12 DIAGNOSIS — M25572 Pain in left ankle and joints of left foot: Secondary | ICD-10-CM

## 2022-12-12 DIAGNOSIS — R2689 Other abnormalities of gait and mobility: Secondary | ICD-10-CM

## 2022-12-12 DIAGNOSIS — M25672 Stiffness of left ankle, not elsewhere classified: Secondary | ICD-10-CM | POA: Diagnosis not present

## 2022-12-12 DIAGNOSIS — M6281 Muscle weakness (generalized): Secondary | ICD-10-CM

## 2022-12-12 NOTE — Therapy (Signed)
OUTPATIENT PHYSICAL THERAPY TREATMENT NOTE   Patient Name: Robin Glenn MRN: [id number] DOB:10-Feb-1991, 32 y.o., female Today's Date: 12/12/2022  END OF SESSION:  PT End of Session - 12/12/22 1151     Visit Number 11    Number of Visits 12    Date for PT Re-Evaluation 12/12/22    Authorization Type AETNA MCR    PT Start Time 9563    PT Stop Time 1227    PT Time Calculation (min) 42 min    Equipment Utilized During Treatment Gait belt    Activity Tolerance Patient tolerated treatment well    Behavior During Therapy WFL for tasks assessed/performed                 Past Medical History:  Diagnosis Date   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 No pertinent surgical history. 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    Pt reports improvement in ankle motion and feels the aquatic session are very helpful.   Previous:   Pt denies any adverse effects after prior Rx.  Pt reports she has been performing DF AROM at home.  Pt's boyfriend states she hasn't used her rollator in 6 months.  Pt states they put weights in rollator for improved stability.   Pt has increased difficulty with performing transfers currently and requires assistance with transfers  including in shower at home.  Pt has difficulty with pulling pants up.  She has difficulty with foot staying on pedal due to moving inward.  Pt has increased difficulty with lifting L LE on pedal and c/o's of L LE weakness.  Pt works out with a Physiological scientist 3x/wk though is more limited with mobilty and with activities.    PERTINENT HISTORY: [name]--pt is in a W/C.  She requires maximum assist with transfers.  Pt required assistance with sitting.   Pt is a FALL RISK.  Use a gait belt.    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  0/10 Current and best, 6/10 worst L ankle  PRECAUTIONS: Fall and Other: Requires extensive assistance with transfers  WEIGHT BEARING RESTRICTIONS: No  FALLS:  Has patient fallen in last 6 months? Yes. Number of falls 1 with transfer  LIVING ENVIRONMENT: Lives with: lives with their family Lives in: House/apartment Stairs: 2 story home Has following equipment at home: stair lift, grab bars, Rollator  OCCUPATION: Part time job as a Sales promotion account executive.    PLOF: Needs assistance with ADLs  Family assist with ADLs.  Pt in W/C.  Pt was able to walk a few mins with her personal trainer holding her up her prior to June.  Pt able to perform transfers independently at home with grab bars and required assistance out in the community.  Pt  was able to take a few steps with rollator at home.    PATIENT GOALS: to improve strength of ankle, increase Wb'ing on L ankle, perform mobility without brace, walk a couple of mins on TM. 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  L ankle MRI on 10/02/2022: IMPRESSION: 1. Mild tenosynovitis of the peroneal tendons. 2. Ligaments of the medial and lateral ankle ligaments are intact. 3. Small ankle joint effusion. 4. No evidence of fracture or  osteonecrosis. 5. Mild edema of the Kager's fat pad. No fluid collection or hematoma.   FOTO 1/30 49pts  TODAY'S TREATMENT:  1/30 Boyfriend available during session for dependent transfers to mat table   Manually assisted gastroc stretching for L LE  Ankle isometrics 3s 10x DF, IV, EV LAQ 2lb 3x10 S/L clam with assist and without 3x5 and then 2x10 as contraction improved L LE leg manually resisted leg press 3x10;  Supine bridge with ADD 3x10 Ball ADD squeeze 3x10 Half turns/single leg bridge to roll 3x10 each  Previous:   Aquatic: Pt seen for aquatic therapy today.  Treatment took place in water 3.25-4.5 ft in depth at the [facility] pool. Temp of water was 91.  Pt entered/exited the pool via lift. Boy friend assisting.  Max assist to and from wc and lift (boyfriend).  Seated *water walker:  7.5 Lb weigth lle; 5 lb weight rle. Standing in 4.5 ft then decreasing to 4.0 ft -with assistance of Lance (BF) pt standing in each depth weight bearing through bilat LE. Weight shifting. Pt requires max assist to gain and sustain position.   -therapist manually assisting lle foot flat on bottom of pool and manual added wb through pt's hips for weight shifting R/L *Suspended with yellow noodle wrapped posteriorly under arms.  Pt cues on gaining and maintaining head above water/ balance with movement of LE submerged as able *suspended supine: supported by Micah Noel: hip add/abd; knee flex/ex; hip extension. Requires assist with add lle to neurtral.  If given extra time pt able to complete ~50 of range *suspended prone: cues for hip add/abd and kicking.  Pt requires the buoyancy and hydrostatic pressure of water for support, and to offload joints by unweighting joint load by at least 50 % in navel deep water and by at least 75-80% in chest to neck deep water.  Viscosity of the water is needed for resistance of strengthening. Water current perturbations provides challenge to standing  balance requiring increased core activation.    1/23 Boyfriend available during session for dependent transfers to mat table   Manually assisted gastroc stretching for L LE  Ankle isometrics 3s 10x DF, IV, EV LAQ 2lb 3x10 S/L clam with assist and without 3x5 and then 2x10 as contraction improved L LE leg manually resisted leg press 3x10;  Supine bridge with ADD 3x10 Ball ADD squeeze 3x10 Supine half turns/half bridges to simulate rolling in bed position 10x    Prior visit land based: PATIENT SURVEYS:   FOTO:  38 with a goal of 62 at visit #15.  FOTO 1/11 43 FOTO 1/30  Therapeutic Activity: Standing weighshift with caregiver assist at cable column (heaviest setting in order to stabilize); gait belt used and LE foot flat position manually set by PT VC and TC given for weightshift at the hip vs trunk 2x10  Supine bridge 2x10 (PT anchor feet, boyfriend holding knee in alignment) Attempted steps with bilat support; maxA for trunk support, able to take 2 half steps with L and R LE, L ankle in IV throughout  Therapeutic Exercise: Seated Ankle DF AROM 2x10 with 5s hold into DF supine hip ABD and ADD 2x10 Manual resisted single leg L leg press 3x10  Neuro:   NMES Russian ankle EV; 80 hz, 50% duty, 10/10 cycle, 5s ramp, 32 mA 10 mins   PATIENT EDUCATION:  Education details: anatomy, exercise progression,  muscle firing,  HEP and HEP safety, POC  Person educated:  Pt and boyfriend Education method: Explanation, Demonstration, Tactile cues, and Verbal cues Education comprehension: verbalized understanding, returned demonstration, verbal cues required, tactile cues required, and needs further education  HOME EXERCISE PROGRAM: Access Code: [email or web address] URL: [email or web address] Date: 11/14/2022 Prepared by: Daleen Bo  Exercises - Supine  Ankle Circles  - 1 x daily - 7 x weekly - 2 sets - 10 reps - Supine Ankle Dorsiflexion and Plantarflexion AROM  - 1 x daily - 7 x weekly - 2 sets - 10 reps - Seated Hip Abduction with Resistance  - 1 x daily - 7 x weekly - 3 sets - 10 reps - Standing Weight Shift  - 1 x daily - 7 x weekly - 1 sets - 1 reps - 5 min total hold  ASSESSMENT:  CLINICAL IMPRESSION: On land, pt with more active ankle EV ROM as well as better control of hip rotation/ABD position when performing LE exercise. Pt able to press through bilat LE today with less assistance for hip and foot position. Pt does still have better activation with global LE extension and full body tension, so some exercises modified to facilitate more L LE activation. Pt has made significant progress with the L ankle and hip motor control in the setting of her FA. Plan to continue working on progression towards standing/walking as able. Pt is able to get to foot flat today without assistance today but does take AAROM for a warm up. Pt will benefit from skilled PT services to address impairments and to improve overall function.      OBJECTIVE IMPAIRMENTS: Abnormal gait, decreased activity tolerance, decreased balance, decreased coordination, decreased endurance, decreased mobility, difficulty walking, decreased ROM, decreased strength, hypomobility, impaired flexibility, postural dysfunction, and pain.   ACTIVITY LIMITATIONS: standing, stairs, transfers, bed mobility, dressing, and locomotion level  PARTICIPATION LIMITATIONS: community activity and workout activities  PERSONAL FACTORS: 1 comorbidity: [name] and Progression of sx's and mobility deficits  are also affecting patient's functional outcome.   REHAB POTENTIAL: Fair due to Freidreich's ataxia, progression of sx's, and mobility deficits  CLINICAL DECISION MAKING: Evolving/moderate complexity  EVALUATION COMPLEXITY: Moderate   GOALS:  SHORT TERM GOALS: Target date:   11/22/2022   Pt will be able to actively perform eversion for improved ankle position and strength.  Baseline: Goal status: INITIAL  2.  Pt will demo improved positioning of L ankle in W/C based upon visual observation for improved activation of peroneals and proper length tension relationship for ankle mm for improved stability.   Baseline:  Goal status: INITIAL  3.  Pt will demo improved ankle DF AROM to neutral for improved ankle mobility.  Baseline:  Goal status: INITIAL  4.  Pt will report at least a 25% improvement in mobility.  Baseline:  Goal status: INITIAL Target date: 11/29/2022   LONG TERM GOALS: Target date: 12/12/2022  Pt will report she is able to perform transfers at home with grab bars independently.   Baseline:  Goal status: INITIAL  2.  Pt will be able to perform transfers in the clinic with min assist.   Baseline:  Goal status: INITIAL  3.  Pt will demo improved L ankle AROM to 5-10 deg in DF and 5 deg in Eve for improved ankle ROM and positioning with mobility.  Baseline:  Goal status: INITIAL  4.  Pt will be able to tolerate 4-5 mins of standing with min assist with UE support for improved tolerance with mobility and activities around her home.  Baseline:  Goal status: INITIAL  5.  Pt will be able to tolerate minimal to moderate manual resistance in L ankle DF and Eve for improved tolerance with Wb'ing and performance of transfers and ambulation.  Baseline:  Goal status: INITIAL    PLAN:  PT FREQUENCY: 1-2x/week  PT DURATION: 6 weeks  PLANNED INTERVENTIONS: Therapeutic exercises, Therapeutic activity, Neuromuscular re-education, Balance training, Gait training, Patient/Family education, Self Care, Joint mobilization, DME instructions, Aquatic Therapy, Dry Needling, Electrical stimulation, Cryotherapy, Moist heat, Taping, Manual therapy, and Re-evaluation  PLAN FOR NEXT SESSION:  Pt is a FALL RISK.  Pt requires max assist +2 to perform transfer.  Use a gait belt.  Ankle DF AROM and eve PROM.  L quad and hip strength; use lift at pool   Daleen Bo PT, DPT 12/12/22 12:52 PM

## 2022-12-14 ENCOUNTER — Encounter (HOSPITAL_BASED_OUTPATIENT_CLINIC_OR_DEPARTMENT_OTHER): Payer: Self-pay | Admitting: Physical Therapy

## 2022-12-14 ENCOUNTER — Encounter (HOSPITAL_BASED_OUTPATIENT_CLINIC_OR_DEPARTMENT_OTHER): Payer: Medicare HMO | Admitting: Physical Therapy

## 2022-12-14 ENCOUNTER — Ambulatory Visit (HOSPITAL_BASED_OUTPATIENT_CLINIC_OR_DEPARTMENT_OTHER): Payer: Medicare HMO | Attending: Neurology | Admitting: Physical Therapy

## 2022-12-14 DIAGNOSIS — R2689 Other abnormalities of gait and mobility: Secondary | ICD-10-CM | POA: Diagnosis not present

## 2022-12-14 DIAGNOSIS — M25672 Stiffness of left ankle, not elsewhere classified: Secondary | ICD-10-CM | POA: Insufficient documentation

## 2022-12-14 DIAGNOSIS — M25572 Pain in left ankle and joints of left foot: Secondary | ICD-10-CM | POA: Diagnosis not present

## 2022-12-14 DIAGNOSIS — M6281 Muscle weakness (generalized): Secondary | ICD-10-CM | POA: Diagnosis not present

## 2022-12-14 NOTE — Therapy (Signed)
OUTPATIENT PHYSICAL THERAPY TREATMENT NOTE   Patient Name: Robin Glenn MRN: [id number] DOB:August 23, 1991, 32 y.o., female Today's Date: 12/14/2022  END OF SESSION:  PT End of Session - 12/14/22 1345     Visit Number 12    Number of Visits 12    Date for PT Re-Evaluation 12/12/22    Authorization Type [employer] MCR    PT Start Time 1117    PT Stop Time 1200    PT Time Calculation (min) 43 min    Equipment Utilized During Treatment Gait belt    Activity Tolerance Patient tolerated treatment well    Behavior During Therapy WFL for tasks assessed/performed                 Past Medical History:  Diagnosis Date   [name] (HCC)    History reviewed. No pertinent surgical history. 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    Pt reports being a little tired after last aquatic session  Previous:   Pt denies any adverse effects after prior Rx.  Pt reports she has been performing DF AROM at home.  Pt's boyfriend states she hasn't used her rollator in 6 months.  Pt states they put weights in rollator for improved stability.   Pt has increased difficulty with performing transfers currently and requires assistance with transfers including in shower at home.  Pt has difficulty with pulling pants up.  She has  difficulty with foot staying on pedal due to moving inward.  Pt has increased difficulty with lifting L LE on pedal and c/o's of L LE weakness.  Pt works out with a Systems analyst 3x/wk though is more limited with mobilty and with activities.    PERTINENT HISTORY: [name]--pt is in a W/C.  She requires maximum assist with transfers.  Pt required assistance with sitting.   Pt is a FALL RISK.  Use a gait belt.    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  0/10 Current and best, 6/10 worst L ankle  PRECAUTIONS: Fall and Other: Requires extensive assistance with transfers  WEIGHT BEARING RESTRICTIONS: No  FALLS:  Has patient fallen in last 6 months? Yes. Number of falls 1 with transfer  LIVING ENVIRONMENT: Lives with: lives with their family Lives in: House/apartment Stairs: 2 story home Has following equipment at home: stair lift, grab bars, Rollator  OCCUPATION: Part time job as a Runner, broadcasting/film/video.    PLOF: Needs assistance with ADLs  Family assist with ADLs.  Pt in W/C.  Pt was able to walk a few mins with her personal trainer holding her up her prior to June.  Pt able to perform transfers independently at home with grab bars and required assistance out in the community.  Pt was able to take a few steps with rollator at home.  PATIENT GOALS: to improve strength of ankle, increase Wb'ing on L ankle, perform mobility without brace, walk a couple of mins on TM. 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  L ankle MRI on 10/02/2022: IMPRESSION: 1. Mild tenosynovitis of the peroneal tendons. 2. Ligaments of the medial and lateral ankle ligaments are intact. 3. Small ankle joint effusion. 4. No evidence of fracture or osteonecrosis. 5. Mild edema of the Kager's fat pad. No fluid collection  or hematoma.   FOTO 1/30 49pts  TODAY'S TREATMENT:  12/14/22 Aquatic: Pt seen for aquatic therapy today.  Treatment took place in water 3.25-4.5 ft in depth at the [facility] pool. Temp of water was 91.  Pt entered/exited the pool via lift. Boy friend assisting.  Max assist to and from wc and lift (boyfriend).   *supported by noodle and therapist/boyfriend 7.5 Lb weigth lle; 5 lb weight rle. Standing in  4.0 ft leaning against wall then hha -with assistance  pt standing weight bearing through bilat LE. Weight shifting. Pt requires max -mod assist to gain position.  Better control of le in setting   -Cues for left foot flat which pt able to complete indep/therapist manually assisting position to remain in place on floor manual added wb through pt's hips for weight shifting R/L. Pt able to shift R and Left requiring ~50% assistance *Gait training: encouraged reciprocal le movements LE walking across pool with max-mod assist for advancement and placement of LE *Suspended supine with yellow noodle and supported by therapist: add/abd; knees to chest; modified sit up *suspended prone: for post core isometric (pt did not tolerate well)  Pt requires the buoyancy and hydrostatic pressure of water for support, and to offload joints by unweighting joint load by at least 50 % in navel deep water and by at least 75-80% in chest to neck deep water.  Viscosity of the water is needed for resistance of strengthening. Water current perturbations provides challenge to standing balance requiring increased core activation.  1/30 Boyfriend available during session for dependent transfers to mat table   Manually assisted gastroc stretching for L LE  Ankle isometrics 3s 10x DF, IV, EV LAQ 2lb 3x10 S/L clam with assist and without 3x5 and then 2x10 as contraction improved L LE leg manually resisted leg press 3x10;  Supine bridge with ADD 3x10 Ball ADD squeeze 3x10 Half turns/single leg bridge to  roll 3x10 each  Previous:   Aquatic: Pt seen for aquatic therapy today.  Treatment took place in water 3.25-4.5 ft in depth at the [facility] pool. Temp of water was 91.  Pt entered/exited the pool via lift. Boy friend assisting.  Max assist to and from wc and lift (boyfriend).   *standing supported by yellow noodle and assistance of boyfriend:  7.5 Lb weigth lle; 5 lb weight rle. Standing iin 4 ft leaning aginst wall then with manual assist +1 then +2 -with assistance/added manual weight through hips weight shift r/L. Pt requires max mod assist to gain and sustain position.   - Pt able to indep gain left foot flat, therapist manually assisting lle to remain on floor. *Suspended with yellow noodle wrapped posteriorly under arms.  Pt cues on gaining and maintaining head above water/ balance with movement of LE submerged as able *Gait 1 width max-mod assist with le advancement and placement of floor  *suspended supine: therapist supporting:  core strengthening knees to chest 2x5; then modified sit up moving from verical to sup then pulling into sit up position. hip add/abd;  *suspended prone: for post core isometric (did not tolerate well)  Pt requires the buoyancy and hydrostatic pressure of water for support, and to offload joints by unweighting joint load by at least 50 % in navel deep water and by at least 75-80% in chest to neck deep water.  Viscosity of the water is needed for resistance of strengthening. Water current perturbations provides challenge to standing balance requiring increased core activation.    1/23 Boyfriend available during session for dependent transfers to mat table   Manually assisted gastroc stretching for L LE  Ankle isometrics 3s 10x DF, IV, EV LAQ 2lb 3x10 S/L clam with assist and without 3x5 and then 2x10 as contraction improved L LE leg manually resisted leg press 3x10;  Supine bridge with ADD 3x10 Ball ADD squeeze 3x10 Supine half turns/half  bridges to simulate rolling in bed position 10x    Prior visit land based: PATIENT SURVEYS:   FOTO:  38 with a goal of 62 at visit #15.  FOTO 1/11 43 FOTO 1/30                                                                                                                             Therapeutic Activity: Standing weighshift with caregiver assist at cable column (heaviest setting in order to stabilize); gait belt used and LE foot flat position manually set by PT VC and TC given for weightshift at the hip vs trunk 2x10  Supine bridge 2x10 (PT anchor feet, boyfriend holding knee in alignment) Attempted steps with bilat support; maxA for trunk support, able to take 2 half steps with L and R LE, L ankle in IV throughout  Therapeutic Exercise: Seated Ankle DF AROM 2x10 with 5s hold into DF supine hip ABD and ADD 2x10 Manual resisted single leg L leg press 3x10  Neuro:   NMES Russian ankle EV; 80 hz, 50% duty, 10/10 cycle, 5s ramp, 32 mA 10 mins   PATIENT EDUCATION:  Education details: anatomy, exercise progression,  muscle firing,  HEP and HEP safety, POC  Person educated:  Pt and boyfriend Education method: Explanation, Demonstration, Tactile cues, and Verbal cues Education comprehension: verbalized understanding, returned demonstration, verbal cues required, tactile cues required, and needs further education  HOME EXERCISE PROGRAM: Access Code: [email or web address] URL: [email or web address] Date: 11/14/2022 Prepared by: Daleen Bo  Exercises - Supine Ankle Circles  - 1 x daily - 7 x weekly - 2 sets - 10 reps - Supine Ankle Dorsiflexion and Plantarflexion AROM  - 1 x daily - 7 x weekly - 2 sets - 10 reps - Seated Hip Abduction with Resistance  - 1 x daily - 7 x weekly - 3 sets - 10 reps - Standing Weight Shift  - 1 x daily - 7 x weekly - 1 sets - 1 reps -  5 min total hold  ASSESSMENT:  CLINICAL IMPRESSION: Did not use water walker today as pt with improved ability to  gain upright positioning with +2 assist without need for walker.  Completed against wall (ue support of bilat UE in 4.5 ft) also completed without UE support in 3.8 and 4.3 ft.  With cuing pt able to straighten left ankle gaining foot flat indep a few times with assistance  but assistance needed to maintain position. Pt demonstrate improved ability to weight shift R and L indep completing ~ 50% of time. Trialed reciprocal LE movements walking across pool.  She requires 90% assistance with advancing le's for proper placement of footing before translating over but is able to initiate hip flexion with each step.   Left foot flat improves as session continues with constant weight bearing through joint.    OBJECTIVE IMPAIRMENTS: Abnormal gait, decreased activity tolerance, decreased balance, decreased coordination, decreased endurance, decreased mobility, difficulty walking, decreased ROM, decreased strength, hypomobility, impaired flexibility, postural dysfunction, and pain.   ACTIVITY LIMITATIONS: standing, stairs, transfers, bed mobility, dressing, and locomotion level  PARTICIPATION LIMITATIONS: community activity and workout activities  PERSONAL FACTORS: 1 comorbidity: [name] and Progression of sx's and mobility deficits  are also affecting patient's functional outcome.   REHAB POTENTIAL: Fair due to Freidreich's ataxia, progression of sx's, and mobility deficits  CLINICAL DECISION MAKING: Evolving/moderate complexity  EVALUATION COMPLEXITY: Moderate   GOALS:  SHORT TERM GOALS: Target date:  11/22/2022   Pt will be able to actively perform eversion for improved ankle position and strength.  Baseline: Goal status: INITIAL  2.  Pt will demo improved positioning of L ankle in W/C based upon visual observation for improved activation of peroneals and proper length tension relationship for ankle mm for improved stability.   Baseline:  Goal status: INITIAL  3.  Pt will demo  improved ankle DF AROM to neutral for improved ankle mobility.  Baseline:  Goal status: INITIAL  4.  Pt will report at least a 25% improvement in mobility.  Baseline:  Goal status: INITIAL Target date: 11/29/2022   LONG TERM GOALS: Target date: 12/12/2022  Pt will report she is able to perform transfers at home with grab bars independently.   Baseline:  Goal status: INITIAL  2.  Pt will be able to perform transfers in the clinic with min assist.   Baseline:  Goal status: INITIAL  3.  Pt will demo improved L ankle AROM to 5-10 deg in DF and 5 deg in Eve for improved ankle ROM and positioning with mobility.  Baseline:  Goal status: INITIAL  4.  Pt will be able to tolerate 4-5 mins of standing with min assist with UE support for improved tolerance with mobility and activities around her home.  Baseline:  Goal status: INITIAL  5.  Pt will be able to tolerate minimal to moderate manual resistance in L ankle DF and Eve for improved tolerance with Wb'ing and performance of transfers and ambulation.  Baseline:  Goal status: INITIAL    PLAN:  PT FREQUENCY: 1-2x/week  PT DURATION: 6 weeks  PLANNED INTERVENTIONS: Therapeutic exercises, Therapeutic activity, Neuromuscular re-education, Balance training, Gait training, Patient/Family education, Self Care, Joint mobilization, DME instructions, Aquatic Therapy, Dry Needling, Electrical stimulation, Cryotherapy, Moist heat, Taping, Manual therapy, and Re-evaluation  PLAN FOR NEXT SESSION:  Pt is a FALL RISK.  Pt requires max assist +2 to perform transfer. Use a gait belt.  Ankle DF AROM and eve PROM.  L quad  and hip strength; use lift at pool   [facility]) Shontez Sermon MPT 12/14/22 6:42 PM

## 2023-01-02 ENCOUNTER — Encounter (HOSPITAL_BASED_OUTPATIENT_CLINIC_OR_DEPARTMENT_OTHER): Payer: Self-pay | Admitting: Physical Therapy

## 2023-01-02 ENCOUNTER — Ambulatory Visit (HOSPITAL_BASED_OUTPATIENT_CLINIC_OR_DEPARTMENT_OTHER): Payer: Medicare HMO | Admitting: Physical Therapy

## 2023-01-02 DIAGNOSIS — R2689 Other abnormalities of gait and mobility: Secondary | ICD-10-CM | POA: Diagnosis not present

## 2023-01-02 DIAGNOSIS — M25572 Pain in left ankle and joints of left foot: Secondary | ICD-10-CM | POA: Diagnosis not present

## 2023-01-02 DIAGNOSIS — M25672 Stiffness of left ankle, not elsewhere classified: Secondary | ICD-10-CM | POA: Diagnosis not present

## 2023-01-02 DIAGNOSIS — M6281 Muscle weakness (generalized): Secondary | ICD-10-CM

## 2023-01-02 NOTE — Addendum Note (Signed)
Addended by: Daleen Bo on: 01/02/2023 02:20 PM   Modules accepted: Orders

## 2023-01-02 NOTE — Therapy (Unsigned)
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 No pertinent surgical history. 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    Pt reports doing well.  Had hard time getting appointment but is now scheduled out for the next 6 or so weeks  Previous:   Pt denies any adverse effects after prior Rx.  Pt reports she has been performing DF AROM at home.  Pt's boyfriend states she hasn't used her rollator in 6 months.  Pt states they put weights in rollator for improved stability.   Pt has increased difficulty with performing transfers currently and requires assistance with transfers including in shower at home.  Pt has difficulty with pulling pants up.  She has difficulty with foot staying on pedal due to moving inward.  Pt has increased difficulty with  lifting L LE on pedal and c/o's of L LE weakness.  Pt works out with a Physiological scientist 3x/wk though is more limited with mobilty and with activities.    PERTINENT HISTORY: [name]--pt is in a W/C.  She requires maximum assist with transfers.  Pt required assistance with sitting.   Pt is a FALL RISK.  Use a gait belt.    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  0/10 Current and best, 6/10 worst L ankle  PRECAUTIONS: Fall and Other: Requires extensive assistance with transfers  WEIGHT BEARING RESTRICTIONS: No  FALLS:  Has patient fallen in last 6 months? Yes. Number of falls 1 with transfer  LIVING ENVIRONMENT: Lives with: lives with their family Lives in: House/apartment Stairs: 2 story home Has following equipment at home: stair lift, grab bars, Rollator  OCCUPATION: Part time job as a Sales promotion account executive.    PLOF: Needs assistance with ADLs  Family assist with ADLs.  Pt in W/C.  Pt was able to walk a few mins with her personal trainer holding her up her prior to June.  Pt able to perform transfers independently at home with grab bars and required assistance out in the community.  Pt was able to take a few steps with rollator at home.    PATIENT GOALS: to improve strength of ankle, increase Wb'ing on L ankle, perform mobility without brace, walk  a couple of mins on TM. 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  L ankle MRI on 10/02/2022: IMPRESSION: 1. Mild tenosynovitis of the peroneal tendons. 2. Ligaments of the medial and lateral ankle ligaments are intact. 3. Small ankle joint effusion. 4. No evidence of fracture or osteonecrosis. 5. Mild edema of the Kager's fat pad. No fluid collection or hematoma.   FOTO 1/30 49pts  TODAY'S TREATMENT: 01/02/23 Aquatic: Pt seen for aquatic  therapy today.  Treatment took place in water 3.25-4.5 ft in depth at the [facility]. Temp of water was 91.  Pt entered/exited the pool via lift. Boy friend assisting.  Max assist to and from wc and lift (boyfriend).   *supported by noodle and therapist/boyfriend 7.5 Lb weigth lle; 5 lb weight rle.  Standing in  4.0 ft ue support on wall -with assistance weight bearing through bilat LE. Weight shifting laterally then forward back.  -side stepping ue support on wall R/L max-mod assist for placement of LE. *Gait training: encouraged reciprocal le movements LE walking across pool with max-mod assist for advancement and placement of LE *Suspended supine with yellow noodle and supported by therapist: add/abd *vertical suspension noodle wrapped across chest and 2nd across back: pt propelling as able through 4.57f: cues for marching   Pt requires the buoyancy and hydrostatic pressure of water for support, and to offload joints by unweighting joint load by at least 50 % in navel deep water and by at least 75-80% in chest to neck deep water.  Viscosity of the water is needed for resistance of strengthening. Water current perturbations provides challenge to standing balance requiring increased core activation.     12/14/22 Aquatic: Pt seen for aquatic therapy today.  Treatment took place in water 3.25-4.5 ft in depth at the [facility] Temp of water was 91.  Pt entered/exited the pool via lift. Boy friend assisting.  Max assist to and from wc and lift (boyfriend).     *supported by noodle and therapist/boyfriend 7.5 Lb weigth lle; 5 lb weight rle. Standing in  4.0 ft leaning against wall then hha -with assistance  pt standing weight bearing through bilat LE. Weight shifting. Pt requires max -mod assist to gain position.  Better control of le in setting   -Cues for left foot flat which pt able to complete indep/therapist manually assisting position to remain in place on  floor manual added wb through pt's hips for weight shifting R/L. Pt able to shift R and Left requiring ~50% assistance *Gait training: encouraged reciprocal le movements LE walking across pool with max-mod assist for advancement and placement of LE *Suspended supine with yellow noodle and supported by therapist: add/abd; knees to chest; modified sit up *suspended prone: for post core isometric (pt did not tolerate well)   Pt requires the buoyancy and hydrostatic pressure of water for support, and to offload joints by unweighting joint load by at least 50 % in navel deep water and by at least 75-80% in chest to neck deep water.  Viscosity of the water is needed for resistance of strengthening. Water current perturbations provides challenge to standing balance requiring increased core activation.  1/30 Boyfriend available during session for dependent transfers to mat table   Manually assisted gastroc stretching for L LE  Ankle isometrics 3s 10x DF, IV, EV LAQ 2lb 3x10 S/L clam with assist  and without 3x5 and then 2x10 as contraction improved L LE leg manually resisted leg press 3x10;  Supine bridge with ADD 3x10 Ball ADD squeeze 3x10 Half turns/single leg bridge to roll 3x10 each  Previous:   Aquatic: Pt seen for aquatic therapy today.  Treatment took place in water 3.25-4.5 ft in depth at the [facility]. Temp of water was 91.  Pt entered/exited the pool via lift. Boy friend assisting.  Max assist to and from wc and lift (boyfriend).   *standing supported by yellow noodle and assistance of boyfriend:  7.5 Lb weigth lle; 5 lb weight rle. Standing iin 4 ft leaning aginst wall then with manual assist +1 then +2 -with assistance/added manual weight through hips weight shift r/L. Pt requires max mod assist to gain and sustain position.   - Pt able to indep gain left foot flat, therapist manually assisting lle to remain on floor. *Suspended with yellow noodle wrapped posteriorly  under arms.  Pt cues on gaining and maintaining head above water/ balance with movement of LE submerged as able *Gait 1 width max-mod assist with le advancement and placement of floor  *suspended supine: therapist supporting: core strengthening knees to chest 2x5; then modified sit up moving from verical to sup then pulling into sit up position. hip add/abd;  *suspended prone: for post core isometric (did not tolerate well)  Pt requires the buoyancy and hydrostatic pressure of water for support, and to offload joints by unweighting joint load by at least 50 % in navel deep water and by at least 75-80% in chest to neck deep water.  Viscosity of the water is needed for resistance of strengthening. Water current perturbations provides challenge to standing balance requiring increased core activation.    1/23 Boyfriend available during session for dependent transfers to mat table   Manually assisted gastroc stretching for L LE  Ankle isometrics 3s 10x DF, IV, EV LAQ 2lb 3x10 S/L clam with assist and without 3x5 and then 2x10 as contraction improved L LE leg manually resisted leg press 3x10;  Supine bridge with ADD 3x10 Ball ADD squeeze 3x10 Supine half turns/half bridges to simulate rolling in bed position 10x    Prior visit land based: PATIENT SURVEYS:   FOTO:  38 with a goal of 62 at visit #15.  FOTO 1/11 43 FOTO 1/30                                                                                                                             Therapeutic Activity: Standing weighshift with caregiver assist at cable column (heaviest setting in order to stabilize); gait belt used and LE foot flat position manually set by PT VC and TC given for weightshift at the hip vs trunk 2x10  Supine bridge 2x10 (PT anchor feet, boyfriend holding knee in alignment) Attempted steps with bilat support; maxA for trunk support, able to take 2 half steps with L and R LE, L  ankle in IV  throughout  Therapeutic Exercise: Seated Ankle DF AROM 2x10 with 5s hold into DF supine hip ABD and ADD 2x10 Manual resisted single leg L leg press 3x10  Neuro:   NMES Russian ankle EV; 80 hz, 50% duty, 10/10 cycle, 5s ramp, 32 mA 10 mins   PATIENT EDUCATION:  Education details: anatomy, exercise progression,  muscle firing,  HEP and HEP safety, POC  Person educated:  Pt and boyfriend Education method: Explanation, Demonstration, Tactile cues, and Verbal cues Education comprehension: verbalized understanding, returned demonstration, verbal cues required, tactile cues required, and needs further education  HOME EXERCISE PROGRAM: Access Code: [email or web address] URL: [email or web address] Date: 11/14/2022 Prepared by: Daleen Bo  Exercises - Supine Ankle Circles  - 1 x daily - 7 x weekly - 2 sets - 10 reps - Supine Ankle Dorsiflexion and Plantarflexion AROM  - 1 x daily - 7 x weekly - 2 sets - 10 reps - Seated Hip Abduction with Resistance  - 1 x daily - 7 x weekly - 3 sets - 10 reps - Standing Weight Shift  - 1 x daily - 7 x weekly - 1 sets - 1 reps - 5 min total hold  ASSESSMENT:  CLINICAL IMPRESSION: Decreased assistance needed with gaining standing position in 4.16f. She demonstrates some ability to coordinate side stepping R/L while maintaining upright position in 4.0 ft and >.    Did not use water walker today as pt with improved ability to gain upright positioning with +2 assist without need for walker.  Completed against wall (ue support of bilat UE in 4.5 ft) also completed without UE support in 3.8 and 4.3 ft.  With cuing pt able to straighten left ankle gaining foot flat indep a few times with assistance  but assistance needed to maintain position. Pt demonstrate improved ability to weight shift R and L indep completing ~ 50% of time. Trialed reciprocal LE movements walking across pool.  She requires 90% assistance with advancing le's for proper placement of footing  before translating over but is able to initiate hip flexion with each step.   Left foot flat improves as session continues with constant weight bearing through joint.    OBJECTIVE IMPAIRMENTS: Abnormal gait, decreased activity tolerance, decreased balance, decreased coordination, decreased endurance, decreased mobility, difficulty walking, decreased ROM, decreased strength, hypomobility, impaired flexibility, postural dysfunction, and pain.   ACTIVITY LIMITATIONS: standing, stairs, transfers, bed mobility, dressing, and locomotion level  PARTICIPATION LIMITATIONS: community activity and workout activities  PERSONAL FACTORS: 1 comorbidity: [name] and Progression of sx's and mobility deficits  are also affecting patient's functional outcome.   REHAB POTENTIAL: Fair due to Freidreich's ataxia, progression of sx's, and mobility deficits  CLINICAL DECISION MAKING: Evolving/moderate complexity  EVALUATION COMPLEXITY: Moderate   GOALS:  SHORT TERM GOALS: Target date:  11/22/2022   Pt will be able to actively perform eversion for improved ankle position and strength.  Baseline: Goal status: INITIAL  2.  Pt will demo improved positioning of L ankle in W/C based upon visual observation for improved activation of peroneals and proper length tension relationship for ankle mm for improved stability.   Baseline:  Goal status: INITIAL  3.  Pt will demo improved ankle DF AROM to neutral for improved ankle mobility.  Baseline:  Goal status: INITIAL  4.  Pt will report at least a 25% improvement in mobility.  Baseline:  Goal status: INITIAL Target date: 11/29/2022   LONG TERM GOALS: Target date: 12/12/2022  Pt will  report she is able to perform transfers at home with grab bars independently.   Baseline:  Goal status: INITIAL  2.  Pt will be able to perform transfers in the clinic with min assist.   Baseline:  Goal status: INITIAL  3.  Pt will demo improved L ankle AROM to  5-10 deg in DF and 5 deg in Eve for improved ankle ROM and positioning with mobility.  Baseline:  Goal status: INITIAL  4.  Pt will be able to tolerate 4-5 mins of standing with min assist with UE support for improved tolerance with mobility and activities around her home.  Baseline:  Goal status: INITIAL  5.  Pt will be able to tolerate minimal to moderate manual resistance in L ankle DF and Eve for improved tolerance with Wb'ing and performance of transfers and ambulation.  Baseline:  Goal status: INITIAL    PLAN:  PT FREQUENCY: 1-2x/week  PT DURATION: 6 weeks  PLANNED INTERVENTIONS: Therapeutic exercises, Therapeutic activity, Neuromuscular re-education, Balance training, Gait training, Patient/Family education, Self Care, Joint mobilization, DME instructions, Aquatic Therapy, Dry Needling, Electrical stimulation, Cryotherapy, Moist heat, Taping, Manual therapy, and Re-evaluation  PLAN FOR NEXT SESSION:  Pt is a FALL RISK.  Pt requires max assist +2 to perform transfer. Use a gait belt.  Ankle DF AROM and eve PROM.  L quad and hip strength; use lift at pool   [facility]) Kejon Feild MPT 01/04/23 12:59 PM

## 2023-01-09 ENCOUNTER — Encounter (HOSPITAL_BASED_OUTPATIENT_CLINIC_OR_DEPARTMENT_OTHER): Payer: Self-pay | Admitting: Physical Therapy

## 2023-01-09 ENCOUNTER — Ambulatory Visit (HOSPITAL_BASED_OUTPATIENT_CLINIC_OR_DEPARTMENT_OTHER): Payer: Medicare HMO | Admitting: Physical Therapy

## 2023-01-09 ENCOUNTER — Encounter (HOSPITAL_BASED_OUTPATIENT_CLINIC_OR_DEPARTMENT_OTHER): Payer: Self-pay

## 2023-01-09 NOTE — Therapy (Addendum)
  OUTPATIENT PHYSICAL THERAPY TREATMENT NOTE   Patient Name: Dicey Summa MRN: [id number] DOB:12-Aug-1991, 32 y.o., female Today's Date: 01/09/2023  END OF SESSION:  PT End of Session - 01/09/23 1433     Visit Number 14    Number of Visits 36    Date for PT Re-Evaluation 02/27/23    Authorization Type [employer] MCR    PT Start Time 1445   was at pool for land appt at start of session   PT Stop Time 1445    PT Time Calculation (min) 0 min    Equipment Utilized During Treatment Gait belt    Activity Tolerance Patient tolerated treatment well    Behavior During Therapy WFL for tasks assessed/performed           PCP: Barbee Shropshire  REFERRING PROVIDER: Felipa Furnace, DPM  REFERRING DIAG: [phone] (ICD-10-CM) - Ankle instability, left  THERAPY DIAG:  Pain in left ankle and joints of left foot  Other abnormalities of gait and mobility  Stiffness of left ankle, not elsewhere classified  Muscle weakness (generalized)  Rationale for Evaluation and Treatment: Rehabilitation  ONSET DATE: June 2023.  No session or treatment rendered today. Scheduling confusion between land and aquatic. Pt to reschedule to appt later in the week. No charge  Daleen Bo PT, DPT 01/09/23 2:54 PM

## 2023-01-11 ENCOUNTER — Ambulatory Visit (HOSPITAL_BASED_OUTPATIENT_CLINIC_OR_DEPARTMENT_OTHER): Payer: Medicare HMO | Admitting: Physical Therapy

## 2023-01-11 ENCOUNTER — Encounter (HOSPITAL_BASED_OUTPATIENT_CLINIC_OR_DEPARTMENT_OTHER): Payer: Self-pay | Admitting: Physical Therapy

## 2023-01-11 DIAGNOSIS — M25572 Pain in left ankle and joints of left foot: Secondary | ICD-10-CM | POA: Diagnosis not present

## 2023-01-11 DIAGNOSIS — M25672 Stiffness of left ankle, not elsewhere classified: Secondary | ICD-10-CM

## 2023-01-11 DIAGNOSIS — R2689 Other abnormalities of gait and mobility: Secondary | ICD-10-CM

## 2023-01-11 DIAGNOSIS — M6281 Muscle weakness (generalized): Secondary | ICD-10-CM | POA: Diagnosis not present

## 2023-01-11 NOTE — Therapy (Signed)
OUTPATIENT PHYSICAL THERAPY TREATMENT NOTE   Patient Name: Robin Glenn MRN: [id number] DOB:[birth year], 32 y.o., female Today's Date: 01/11/2023  END OF SESSION:  PT End of Session - 01/11/23 1248     Visit Number 15    Number of Visits 36    Date for PT Re-Evaluation 02/27/23    Authorization Type [employer]ET[employer] MCR    PT Start Time 1100    PT Stop Time 1140    PT Time Calculation (min) 40 min    Equipment Utilized During Treatment Gait belt    Activity Tolerance Patient tolerated treatment well    Behavior During Therapy WFL for tasks assessed/performed             Past Medical History:  Diagnosis Date   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 No pertinent surgical history. 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    Pt is able to stand in the water but full standing and full flat position is difficult. Pt has had no pain. Pt still feels there is prorgress.   Previous:  Pt's boyfriend states she hasn't used her rollator in 6 months.  Pt states they put weights in rollator for improved stability.   Pt has increased difficulty with performing transfers currently and requires assistance with transfers including in shower at home.  Pt has difficulty with pulling  pants up.  She has difficulty with foot staying on pedal due to moving inward.  Pt has increased difficulty with lifting L LE on pedal and c/o's of L LE weakness.  Pt works out with a Physiological scientist 3x/wk though is more limited with mobilty and with activities.    PERTINENT HISTORY: [name]--pt is in a W/C.  She requires maximum assist with transfers.  Pt required assistance with sitting.   Pt is a FALL RISK.  Use a gait belt.    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  0/10 Current and best, 6/10 worst L ankle  PRECAUTIONS: Fall and Other: Requires extensive assistance with transfers  WEIGHT BEARING RESTRICTIONS: No  FALLS:  Has patient fallen in last 6 months? Yes. Number of falls 1 with transfer  LIVING ENVIRONMENT: Lives with: lives with their family Lives in: House/apartment Stairs: 2 story home Has following equipment at home: stair lift, grab bars, Rollator  OCCUPATION: Part time job as a Sales promotion account executive.    PLOF: Needs assistance with ADLs  Family assist with ADLs.  Pt in W/C.  Pt was able to walk a few mins with her personal trainer holding her up her prior to June.  Pt able to perform transfers independently at home with grab bars and required assistance out in the community.  Pt was able to take a few steps with rollator at home.  PATIENT GOALS: to improve strength of ankle, increase Wb'ing on L ankle, perform mobility without brace, walk a couple of mins on TM. 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  L ankle MRI on 10/02/2022: IMPRESSION: 1. Mild tenosynovitis of the peroneal tendons. 2. Ligaments of the medial and lateral ankle ligaments are intact. 3. Small ankle joint effusion. 4. No evidence of fracture or osteonecrosis. 5. Mild edema of the Kager's fat pad. No  fluid collection or hematoma.   FOTO 1/30 49pts  TODAY'S TREATMENT:  2/29  Boyfriend available during session for dependent transfers to table   Manually assisted gastroc stretching for L LE  AAROM Ankle 2x10 DF EV Seated L LE manual resisted leg press 3x10 "Barstool" height, knee extension/standing with boyfriend controlling trunk and PT setting ankle Supine bridge with manual assisted for foot blocking and knee control 3x10    01/02/23 Aquatic: Pt seen for aquatic therapy today.  Treatment took place in water 3.25-4.5 ft in depth at the [facility]. Temp of water was 91.  Pt entered/exited the pool via lift. Boy friend assisting.  Max assist to and from wc and lift (boyfriend).   *supported by noodle and therapist/boyfriend 7.5 Lb weigth lle; 5 lb weight rle.  Standing in  4.0 ft ue support on wall -with assistance weight bearing through bilat LE. Weight shifting laterally then forward back.  -side stepping ue support on wall R/L max-mod assist for placement of LE. *Gait training: encouraged reciprocal le movements LE walking across pool with max-mod assist for advancement and placement of LE *Suspended supine with yellow noodle and supported by therapist: add/abd *vertical suspension noodle wrapped across chest and 2nd across back: pt propelling as able through 4.54f: cues for marching   Pt requires the buoyancy and hydrostatic pressure of water for support, and to offload joints by unweighting joint load by at least 50 % in navel deep water and by at least 75-80% in chest to neck deep water.  Viscosity of the water is needed for resistance of strengthening. Water current perturbations provides challenge to standing balance requiring increased core activation.     12/14/22 Aquatic: Pt seen for aquatic therapy today.  Treatment [place] 3[place]4.[place] [facility] [place] of [place] 91.  Pt entered/exited the pool via lift.  Boy friend assisting.  Max assist to and from wc and lift (boyfriend).     *supported by noodle and therapist/boyfriend 7.5 Lb weigth lle; 5 lb weight rle. Standing in  4.0 ft leaning against wall then hha -with assistance  pt standing weight bearing through bilat LE. Weight shifting. Pt requires max -mod assist to gain position.  Better control of le in setting   -Cues for left foot flat which pt able to complete indep/therapist manually assisting position to remain in place on floor manual added wb through pt's hips for weight shifting R/L. Pt able to shift R and Left requiring ~50% assistance *Gait training: encouraged reciprocal le movements LE walking across pool with max-mod assist for advancement and placement of LE *Suspended supine with yellow noodle and supported by therapist: add/abd; knees to chest; modified sit up *suspended prone: for post core isometric (pt did not tolerate well)   Pt requires the buoyancy and hydrostatic pressure of water for support, and to offload joints by unweighting joint load  by at least 50 % in navel deep water and by at least 75-80% in chest to neck deep water.  Viscosity of the water is needed for resistance of strengthening. Water current perturbations provides challenge to standing balance requiring increased core activation.  1/30 Boyfriend available during session for dependent transfers to mat table   Manually assisted gastroc stretching for L LE  Ankle isometrics 3s 10x DF, IV, EV LAQ 2lb 3x10 S/L clam with assist and without 3x5 and then 2x10 as contraction improved L LE leg manually resisted leg press 3x10;  Supine bridge with ADD 3x10 Ball ADD squeeze 3x10 Half turns/single leg bridge to roll 3x10 each   PATIENT EDUCATION:  Education details: anatomy, exercise progression,  muscle firing,  HEP and HEP safety, POC  Person educated:  Pt and boyfriend Education method: Explanation, Demonstration, Tactile cues, and Verbal cues Education  comprehension: verbalized understanding, returned demonstration, verbal cues required, tactile cues required, and needs further education  HOME EXERCISE PROGRAM: Access Code: [email or web address] URL: [email or web address] Date: 11/14/2022 Prepared by: Daleen Bo  Exercises - Supine Ankle Circles  - 1 x daily - 7 x weekly - 2 sets - 10 reps - Supine Ankle Dorsiflexion and Plantarflexion AROM  - 1 x daily - 7 x weekly - 2 sets - 10 reps - Seated Hip Abduction with Resistance  - 1 x daily - 7 x weekly - 3 sets - 10 reps - Standing Weight Shift  - 1 x daily - 7 x weekly - 1 sets - 1 reps - 5 min total hold  ASSESSMENT:  CLINICAL IMPRESSION:   Pt is able to get to foot flat today with assistance today with tightness at beginning of session, requiring manual stretching. Pt returns today to land based visit following scheduling issues with clinic. Pt does have improved control of the L hip but the L ankle appears to have stiffened compared to previous session. Pt was able to reach foot flat position during progressed standing tasks and able to perform modified STS with better control of the L knee and hip. Continue with progression towards standing/walking as able. Pt will benefit from skilled PT services to address impairments and to improve overall function.         OBJECTIVE IMPAIRMENTS: Abnormal gait, decreased activity tolerance, decreased balance, decreased coordination, decreased endurance, decreased mobility, difficulty walking, decreased ROM, decreased strength, hypomobility, impaired flexibility, postural dysfunction, and pain.   ACTIVITY LIMITATIONS: standing, stairs, transfers, bed mobility, dressing, and locomotion level  PARTICIPATION LIMITATIONS: community activity and workout activities  PERSONAL FACTORS: 1 comorbidity: [name] and Progression of sx's and mobility deficits  are also affecting patient's functional outcome.   REHAB POTENTIAL: Fair due to  Freidreich's ataxia, progression of sx's, and mobility deficits  CLINICAL DECISION MAKING: Evolving/moderate complexity  EVALUATION COMPLEXITY: Moderate   GOALS:  SHORT TERM GOALS: Target date:  11/22/2022   Pt will be able to actively perform eversion for improved ankle position and strength.  Baseline: Goal status: INITIAL  2.  Pt will demo improved positioning of L ankle in W/C based upon visual observation for improved activation of peroneals and proper length tension relationship for ankle mm for improved stability.   Baseline:  Goal status: INITIAL  3.  Pt will demo improved ankle DF AROM to neutral for improved ankle mobility.  Baseline:  Goal status: INITIAL  4.  Pt will report at least a 25% improvement in mobility.  Baseline:  Goal status: INITIAL Target date: 11/29/2022  LONG TERM GOALS: Target date: 12/12/2022  Pt will report she is able to perform transfers at home with grab bars independently.   Baseline:  Goal status: INITIAL  2.  Pt will be able to perform transfers in the clinic with min assist.   Baseline:  Goal status: INITIAL  3.  Pt will demo improved L ankle AROM to 5-10 deg in DF and 5 deg in Eve for improved ankle ROM and positioning with mobility.  Baseline:  Goal status: INITIAL  4.  Pt will be able to tolerate 4-5 mins of standing with min assist with UE support for improved tolerance with mobility and activities around her home.  Baseline:  Goal status: INITIAL  5.  Pt will be able to tolerate minimal to moderate manual resistance in L ankle DF and Eve for improved tolerance with Wb'ing and performance of transfers and ambulation.  Baseline:  Goal status: INITIAL    PLAN:  PT FREQUENCY: 1-2x/week  PT DURATION: 6 weeks  PLANNED INTERVENTIONS: Therapeutic exercises, Therapeutic activity, Neuromuscular re-education, Balance training, Gait training, Patient/Family education, Self Care, Joint mobilization, DME instructions, Aquatic  Therapy, Dry Needling, Electrical stimulation, Cryotherapy, Moist heat, Taping, Manual therapy, and Re-evaluation  PLAN FOR NEXT SESSION:  Pt is a FALL RISK.  Pt requires max assist +2 to perform transfer. Use a gait belt.  Ankle DF AROM and eve PROM.  L quad and hip strength; use lift at pool   [facility]) [place] MPT 01/11/23 12:59 PM

## 2023-01-16 ENCOUNTER — Ambulatory Visit (HOSPITAL_BASED_OUTPATIENT_CLINIC_OR_DEPARTMENT_OTHER): Payer: Medicare HMO | Admitting: Physical Therapy

## 2023-01-23 ENCOUNTER — Ambulatory Visit (HOSPITAL_BASED_OUTPATIENT_CLINIC_OR_DEPARTMENT_OTHER): Payer: Medicare HMO | Admitting: Physical Therapy

## 2023-01-25 ENCOUNTER — Ambulatory Visit (HOSPITAL_BASED_OUTPATIENT_CLINIC_OR_DEPARTMENT_OTHER): Payer: Medicare HMO | Attending: Neurology | Admitting: Physical Therapy

## 2023-01-25 ENCOUNTER — Encounter (HOSPITAL_BASED_OUTPATIENT_CLINIC_OR_DEPARTMENT_OTHER): Payer: Self-pay | Admitting: Physical Therapy

## 2023-01-25 DIAGNOSIS — M25572 Pain in left ankle and joints of left foot: Secondary | ICD-10-CM

## 2023-01-25 DIAGNOSIS — R2689 Other abnormalities of gait and mobility: Secondary | ICD-10-CM

## 2023-01-25 DIAGNOSIS — M25672 Stiffness of left ankle, not elsewhere classified: Secondary | ICD-10-CM | POA: Diagnosis not present

## 2023-01-25 DIAGNOSIS — M6281 Muscle weakness (generalized): Secondary | ICD-10-CM | POA: Diagnosis not present

## 2023-01-25 NOTE — Therapy (Signed)
OUTPATIENT PHYSICAL THERAPY TREATMENT NOTE   Patient Name: Robin Glenn MRN: [id number] DOB:14-Jan-1991, 32 y.o., female Today's Date: 01/25/2023  END OF SESSION:  PT End of Session - 01/25/23 0942     Visit Number 16    Number of Visits 36    Date for PT Re-Evaluation 02/27/23    Authorization Type AET[employer] MCR    PT Start Time 0950    PT Stop Time 1030    PT Time Calculation (min) 40 min    Equipment Utilized During Treatment Gait belt    Activity Tolerance Patient tolerated treatment well    Behavior During Therapy WFL for tasks assessed/performed             Past Medical History:  Diagnosis Date   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 No pertinent surgical history. 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    Looking forward to session.  No complaints or problems  Previous:  Pt's boyfriend states she hasn't used her rollator in 6 months.  Pt states they put weights in rollator for improved stability.   Pt has increased difficulty with performing transfers currently and requires assistance with transfers including in shower at home.  Pt has difficulty with pulling pants up.  She has difficulty with foot staying on pedal due to moving inward.  Pt has  increased difficulty with lifting L LE on pedal and c/o's of L LE weakness.  Pt works out with a Physiological scientist 3x/wk though is more limited with mobilty and with activities.    PERTINENT HISTORY: [name]--pt is in a W/C.  She requires maximum assist with transfers.  Pt required assistance with sitting.   Pt is a FALL RISK.  Use a gait belt.    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  0/10 Current and best, 6/10 worst L ankle  PRECAUTIONS: Fall and Other: Requires extensive assistance with transfers  WEIGHT BEARING RESTRICTIONS: No  FALLS:  Has patient fallen in last 6 months? Yes. Number of falls 1 with transfer  LIVING ENVIRONMENT: Lives with: lives with their family Lives in: House/apartment Stairs: 2 story home Has following equipment at home: stair lift, grab bars, Rollator  OCCUPATION: Part time job as a Sales promotion account executive.    PLOF: Needs assistance with ADLs  Family assist with ADLs.  Pt in W/C.  Pt was able to walk a few mins with her personal trainer holding her up her prior to June.  Pt able to perform transfers independently at home with grab bars and required assistance out in the community.  Pt was able to take a few steps with rollator at home.    PATIENT GOALS: to improve strength of ankle, increase Wb'ing on L ankle, perform mobility without brace, walk  a couple of mins on TM. 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  L ankle MRI on 10/02/2022: IMPRESSION: 1. Mild tenosynovitis of the peroneal tendons. 2. Ligaments of the medial and lateral ankle ligaments are intact. 3. Small ankle joint effusion. 4. No evidence of fracture or osteonecrosis. 5. Mild edema of the Kager's fat pad. No fluid collection or hematoma.   FOTO 1/30 49pts  TODAY'S  TREATMENT:  01/25/23/24 Aquatic: Pt seen for aquatic therapy today.  Treatment took place in water 3.25-4.5 ft in depth at the [facility]. Temp of water was 91.  Pt entered/exited the pool via lift. Boy friend assisting.  Max assist to and from wc and lift (boyfriend).   *supported by noodle and therapist/boyfriend 7.5 Lb weigth lle; 5 lb weight rle.  Standing in  4.3 ft ue support on wall; then 3.8 -with assistance weight bearing through bilat LE. Weight shifting laterally then forward back.  -working to gain foot flat bilaterally *Suspended supine with yellow noodle and supported by therapist: add/abd; hamstring curl, hip extension; hip flex *vertical suspension noodle wrapped across chest and 2nd across back: pt propelling as able through 4.67f *STS from bench. Max assist x 2 for positioning.  Pt rises with min assist x5. Complete SL STS left x5    Pt requires the buoyancy and hydrostatic pressure of water for support, and to offload joints by unweighting joint load by at least 50 % in navel deep water and by at least 75-80% in chest to neck deep water.  Viscosity of the water is needed for resistance of strengthening. Water current perturbations provides challenge to standing balance requiring increased core activation.     2/29  Boyfriend available during session for dependent transfers to table   Manually assisted gastroc stretching for L LE  AAROM Ankle 2x10 DF EV Seated L LE manual resisted leg press 3x10 "Barstool" height, knee extension/standing with boyfriend controlling trunk and PT setting ankle Supine bridge with manual assisted for foot blocking and knee control 3x10    01/02/23 Aquatic: Pt seen for aquatic therapy today.  Treatment took place in water 3.25-4.5 ft in depth at the [facility] Temp of water was 91.  Pt entered/exited the pool via lift. Boy friend assisting.  Max assist to and from wc and lift (boyfriend).   *supported  by noodle and therapist/boyfriend 7.5 Lb weigth lle; 5 lb weight rle.  Standing in  4.0 ft ue support on wall -with assistance weight bearing through bilat LE. Weight shifting laterally then forward back.  -side stepping ue support on wall R/L max-mod assist for placement of LE. *Gait training: encouraged reciprocal le movements LE walking across pool with max-mod assist for advancement and placement of LE *Suspended supine with yellow noodle and supported by therapist: add/abd *vertical suspension noodle wrapped across chest and 2nd across back: pt propelling as able through 4.585f cues for marching   Pt requires the buoyancy and hydrostatic pressure of water for support, and to offload joints by unweighting joint load by at least 50 % in navel deep water and by at least 75-80% in chest to neck deep water.  Viscosity of the water is needed for resistance of strengthening. Water current perturbations provides challenge to standing balance requiring increased core activation.     12/14/22 Aquatic: Pt seen for aquatic therapy today.  Treatment took  place in water 3.25-4.5 ft in depth at the [facility] pool. Temp of water was 91.  Pt entered/exited the pool via lift. Boy friend assisting.  Max assist to and from wc and lift (boyfriend).     *supported by noodle and therapist/boyfriend 7.5 Lb weigth lle; 5 lb weight rle. Standing in  4.0 ft leaning against wall then hha -with assistance  pt standing weight bearing through bilat LE. Weight shifting. Pt requires max -mod assist to gain position.  Better control of le in setting   -Cues for left foot flat which pt able to complete indep/therapist manually assisting position to remain in place on floor manual added wb through pt's hips for weight shifting R/L. Pt able to shift R and Left requiring ~50% assistance *Gait training: encouraged reciprocal le movements LE walking across pool with max-mod assist for advancement and placement of  LE *Suspended supine with yellow noodle and supported by therapist: add/abd; knees to chest; modified sit up *suspended prone: for post core isometric (pt did not tolerate well)   Pt requires the buoyancy and hydrostatic pressure of water for support, and to offload joints by unweighting joint load by at least 50 % in navel deep water and by at least 75-80% in chest to neck deep water.  Viscosity of the water is needed for resistance of strengthening. Water current perturbations provides challenge to standing balance requiring increased core activation.  1/30 Boyfriend available during session for dependent transfers to mat table   Manually assisted gastroc stretching for L LE  Ankle isometrics 3s 10x DF, IV, EV LAQ 2lb 3x10 S/L clam with assist and without 3x5 and then 2x10 as contraction improved L LE leg manually resisted leg press 3x10;  Supine bridge with ADD 3x10 Ball ADD squeeze 3x10 Half turns/single leg bridge to roll 3x10 each   PATIENT EDUCATION:  Education details: anatomy, exercise progression,  muscle firing,  HEP and HEP safety, POC  Person educated:  Pt and boyfriend Education method: Explanation, Demonstration, Tactile cues, and Verbal cues Education comprehension: verbalized understanding, returned demonstration, verbal cues required, tactile cues required, and needs further education  HOME EXERCISE PROGRAM: Access Code: [email or web address] URL: [email or web address] Date: 11/14/2022 Prepared by: Daleen Bo  Exercises - Supine Ankle Circles  - 1 x daily - 7 x weekly - 2 sets - 10 reps - Supine Ankle Dorsiflexion and Plantarflexion AROM  - 1 x daily - 7 x weekly - 2 sets - 10 reps - Seated Hip Abduction with Resistance  - 1 x daily - 7 x weekly - 3 sets - 10 reps - Standing Weight Shift  - 1 x daily - 7 x weekly - 1 sets - 1 reps - 5 min total hold  ASSESSMENT:  CLINICAL IMPRESSION: Continued weight bearing through le with focus on left foot flat.  Encouraged glut engagement with suspended supine hip extension.  Palpable contraction right glute, minimal contraction appreciated left. Difficult maintaining good le placement with STS transfer training. Goals ongoing     OBJECTIVE IMPAIRMENTS: Abnormal gait, decreased activity tolerance, decreased balance, decreased coordination, decreased endurance, decreased mobility, difficulty walking, decreased ROM, decreased strength, hypomobility, impaired flexibility, postural dysfunction, and pain.   ACTIVITY LIMITATIONS: standing, stairs, transfers, bed mobility, dressing, and locomotion level  PARTICIPATION LIMITATIONS: community activity and workout activities  PERSONAL FACTORS: 1 comorbidity: [name] and Progression of sx's and mobility deficits  are also affecting patient's functional outcome.   REHAB POTENTIAL: Fair due to Freidreich's ataxia, progression of sx's,  and mobility deficits  CLINICAL DECISION MAKING: Evolving/moderate complexity  EVALUATION COMPLEXITY: Moderate   GOALS:  SHORT TERM GOALS: Target date:  11/22/2022   Pt will be able to actively perform eversion for improved ankle position and strength.  Baseline: Goal status: INITIAL  2.  Pt will demo improved positioning of L ankle in W/C based upon visual observation for improved activation of peroneals and proper length tension relationship for ankle mm for improved stability.   Baseline:  Goal status: INITIAL  3.  Pt will demo improved ankle DF AROM to neutral for improved ankle mobility.  Baseline:  Goal status: INITIAL  4.  Pt will report at least a 25% improvement in mobility.  Baseline:  Goal status: INITIAL Target date: 11/29/2022   LONG TERM GOALS: Target date: 12/12/2022  Pt will report she is able to perform transfers at home with grab bars independently.   Baseline:  Goal status: INITIAL  2.  Pt will be able to perform transfers in the clinic with min assist.   Baseline:  Goal status:  INITIAL  3.  Pt will demo improved L ankle AROM to 5-10 deg in DF and 5 deg in Eve for improved ankle ROM and positioning with mobility.  Baseline:  Goal status: INITIAL  4.  Pt will be able to tolerate 4-5 mins of standing with min assist with UE support for improved tolerance with mobility and activities around her home.  Baseline:  Goal status: INITIAL  5.  Pt will be able to tolerate minimal to moderate manual resistance in L ankle DF and Eve for improved tolerance with Wb'ing and performance of transfers and ambulation.  Baseline:  Goal status: INITIAL    PLAN:  PT FREQUENCY: 1-2x/week  PT DURATION: 6 weeks  PLANNED INTERVENTIONS: Therapeutic exercises, Therapeutic activity, Neuromuscular re-education, Balance training, Gait training, Patient/Family education, Self Care, Joint mobilization, DME instructions, Aquatic Therapy, Dry Needling, Electrical stimulation, Cryotherapy, Moist heat, Taping, Manual therapy, and Re-evaluation  PLAN FOR NEXT SESSION:  Pt is a FALL RISK.  Pt requires max assist +2 to perform transfer. Use a gait belt.  Ankle DF AROM and eve PROM.  L quad and hip strength; use lift at pool   [facility]) Lizanne Erker MPT 01/25/23 2:00 PM

## 2023-01-30 ENCOUNTER — Ambulatory Visit (HOSPITAL_BASED_OUTPATIENT_CLINIC_OR_DEPARTMENT_OTHER): Payer: Medicare HMO | Admitting: Physical Therapy

## 2023-01-30 ENCOUNTER — Encounter (HOSPITAL_BASED_OUTPATIENT_CLINIC_OR_DEPARTMENT_OTHER): Payer: Self-pay | Admitting: Physical Therapy

## 2023-01-30 DIAGNOSIS — M25672 Stiffness of left ankle, not elsewhere classified: Secondary | ICD-10-CM

## 2023-01-30 DIAGNOSIS — M25572 Pain in left ankle and joints of left foot: Secondary | ICD-10-CM | POA: Diagnosis not present

## 2023-01-30 DIAGNOSIS — R2689 Other abnormalities of gait and mobility: Secondary | ICD-10-CM | POA: Diagnosis not present

## 2023-01-30 DIAGNOSIS — M6281 Muscle weakness (generalized): Secondary | ICD-10-CM | POA: Diagnosis not present

## 2023-01-30 NOTE — Therapy (Signed)
OUTPATIENT PHYSICAL THERAPY TREATMENT NOTE   Patient Name: Robin Glenn MRN: [id number] DOB:1991-02-18, 32 y.o., female Today's Date: 01/30/2023  END OF SESSION:  PT End of Session - 01/30/23 1446     Visit Number 17    Number of Visits 36    Date for PT Re-Evaluation 02/27/23    Authorization Type [employer]ET[employer] MCR    PT Start Time 1402    PT Stop Time 1441    PT Time Calculation (min) 39 min    Equipment Utilized During Treatment Gait belt    Activity Tolerance Patient tolerated treatment well    Behavior During Therapy WFL for tasks assessed/performed              Past Medical History:  Diagnosis Date   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 No pertinent surgical history. 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    Pt has had no pain since last session. Returns today following being sick for 2 wks.   Previous:  Pt's boyfriend states she hasn't used her rollator in 6 months.  Pt states they put weights in rollator for improved stability.   Pt has increased difficulty with performing transfers currently and requires assistance with transfers including in shower at home.  Pt has difficulty with pulling pants up.  She has difficulty with foot staying on  pedal due to moving inward.  Pt has increased difficulty with lifting L LE on pedal and c/o's of L LE weakness.  Pt works out with a Physiological scientist 3x/wk though is more limited with mobilty and with activities.    PERTINENT HISTORY: [name]--pt is in a W/C.  She requires maximum assist with transfers.  Pt required assistance with sitting.   Pt is a FALL RISK.  Use a gait belt.    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  0/10 Current and best, 6/10 worst L ankle  PRECAUTIONS: Fall and Other: Requires extensive assistance with transfers  WEIGHT BEARING RESTRICTIONS: No  FALLS:  Has patient fallen in last 6 months? Yes. Number of falls 1 with transfer  LIVING ENVIRONMENT: Lives with: lives with their family Lives in: House/apartment Stairs: 2 story home Has following equipment at home: stair lift, grab bars, Rollator  OCCUPATION: Part time job as a Sales promotion account executive.    PLOF: Needs assistance with ADLs  Family assist with ADLs.  Pt in W/C.  Pt was able to walk a few mins with her personal trainer holding her up her prior to June.  Pt able to perform transfers independently at home with grab bars and required assistance out in the community.  Pt was able to take a few steps with rollator at home.    PATIENT GOALS: to improve strength of ankle, increase  Wb'ing on L ankle, perform mobility without brace, walk a couple of mins on TM. 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  L ankle MRI on 10/02/2022: IMPRESSION: 1. Mild tenosynovitis of the peroneal tendons. 2. Ligaments of the medial and lateral ankle ligaments are intact. 3. Small ankle joint effusion. 4. No evidence of fracture or osteonecrosis. 5. Mild edema of the Kager's fat pad. No fluid collection or hematoma.   FOTO 1/30  49pts  TODAY'S TREATMENT:  3/19  Boyfriend available during session for dependent transfers to table   Manually assisted gastroc stretching for L LE  AAROM Ankle 2x10 DF EV Seated hip march 3x10 Seated hip ABD 2x10 LAQ 3x10 Seated L LE manual resisted leg press 3x10 "Barstool" height, knee extension/standing with boyfriend controlling trunk and PT setting ankle 3x10   01/25/23/24 Aquatic: Pt seen for aquatic therapy today.  Treatment took place in water 3.25-4.5 ft in depth at the [facility]. Temp of water was 91.  Pt entered/exited the pool via lift. Boy friend assisting.  Max assist to and from wc and lift (boyfriend).   *supported by noodle and therapist/boyfriend 7.5 Lb weigth lle; 5 lb weight rle.  Standing in  4.3 ft ue support on wall; then 3.8 -with assistance weight bearing through bilat LE. Weight shifting laterally then forward back.  -working to gain foot flat bilaterally *Suspended supine with yellow noodle and supported by therapist: add/abd; hamstring curl, hip extension; hip flex *vertical suspension noodle wrapped across chest and 2nd across back: pt propelling as able through 4.64ft *STS from bench. Max assist x 2 for positioning.  Pt rises with min assist x5. Complete SL STS left x5    Pt requires the buoyancy and hydrostatic pressure of water for support, and to offload joints by unweighting joint load by at least 50 % in navel deep water and by at least 75-80% in chest to neck deep water.  Viscosity of the water is needed for resistance of strengthening. Water current perturbations provides challenge to standing balance requiring increased core activation.     2/29  Boyfriend available during session for dependent transfers to table   Manually assisted gastroc stretching for L LE  AAROM Ankle 2x10 DF EV Seated L LE manual resisted leg press 3x10 "Barstool" height, knee extension/standing with boyfriend controlling trunk and PT setting  ankle Supine bridge with manual assisted for foot blocking and knee control 3x10    01/02/23 Aquatic: Pt seen for aquatic therapy today.  Treatment took place in water 3.25-4.5 ft in depth at the [facility]. Temp of water was 91.  Pt entered/exited the pool via lift. Boy friend assisting.  Max assist to and from wc and lift (boyfriend).   *supported by noodle and therapist/boyfriend 7.5 Lb weigth lle; 5 lb weight rle.  Standing in  4.0 ft ue support on wall -with assistance weight bearing through bilat LE. Weight shifting laterally then forward back.  -side stepping ue support on wall R/L max-mod assist for placement of LE. *Gait training: encouraged reciprocal le movements LE walking across pool with max-mod assist for advancement and placement of LE *Suspended supine with yellow noodle and supported by therapist: add/abd *vertical suspension noodle wrapped across chest and 2nd across back: pt propelling as able through 4.70ft: cues for marching   Pt requires the buoyancy and hydrostatic pressure of water  for support, and to offload joints by unweighting joint load by at least 50 % in navel deep water and by at least 75-80% in chest to neck deep water.  Viscosity of the water is needed for resistance of strengthening. Water current perturbations provides challenge to standing balance requiring increased core activation.     12/14/22 Aquatic: Pt seen for aquatic therapy today.  Treatment took place in water 3.25-4.5 ft in depth at the [facility]. Temp of water was 91.  Pt entered/exited the pool via lift. Boy friend assisting.  Max assist to and from wc and lift (boyfriend).     *supported by noodle and therapist/boyfriend 7.5 Lb weigth lle; 5 lb weight rle. Standing in  4.0 ft leaning against wall then hha -with assistance  pt standing weight bearing through bilat LE. Weight shifting. Pt requires max -mod assist to gain position.  Better control of le in  setting   -Cues for left foot flat which pt able to complete indep/therapist manually assisting position to remain in place on floor manual added wb through pt's hips for weight shifting R/L. Pt able to shift R and Left requiring ~50% assistance *Gait training: encouraged reciprocal le movements LE walking across pool with max-mod assist for advancement and placement of LE *Suspended supine with yellow noodle and supported by therapist: add/abd; knees to chest; modified sit up *suspended prone: for post core isometric (pt did not tolerate well)   Pt requires the buoyancy and hydrostatic pressure of water for support, and to offload joints by unweighting joint load by at least 50 % in navel deep water and by at least 75-80% in chest to neck deep water.  Viscosity of the water is needed for resistance of strengthening. Water current perturbations provides challenge to standing balance requiring increased core activation.  1/30 Boyfriend available during session for dependent transfers to mat table   Manually assisted gastroc stretching for L LE  Ankle isometrics 3s 10x DF, IV, EV LAQ 2lb 3x10 S/L clam with assist and without 3x5 and then 2x10 as contraction improved L LE leg manually resisted leg press 3x10;  Supine bridge with ADD 3x10 Ball ADD squeeze 3x10 Half turns/single leg bridge to roll 3x10 each   PATIENT EDUCATION:  Education details: anatomy, exercise progression,  muscle firing,  HEP and HEP safety, POC  Person educated:  Pt and boyfriend Education method: Explanation, Demonstration, Tactile cues, and Verbal cues Education comprehension: verbalized understanding, returned demonstration, verbal cues required, tactile cues required, and needs further education  HOME EXERCISE PROGRAM: Access Code: [email or web address] URL: [email or web address] Date: 11/14/2022 Prepared by: Daleen Bo  Exercises - Supine Ankle Circles  - 1 x daily - 7 x weekly - 2 sets - 10 reps - Supine  Ankle Dorsiflexion and Plantarflexion AROM  - 1 x daily - 7 x weekly - 2 sets - 10 reps - Seated Hip Abduction with Resistance  - 1 x daily - 7 x weekly - 3 sets - 10 reps - Standing Weight Shift  - 1 x daily - 7 x weekly - 1 sets - 1 reps - 5 min total hold  ASSESSMENT:  CLINICAL IMPRESSION: Pt returns to land based session following period of illness. Pt was able to continue with previous exercise without excessive difficulty with regaining ankle DF AROM. Pt was able to improve standing mechanics today with ankle flat on ground and ability to activity quadriceps. Plan to continue with progression towards standing on land with minA. Pt would  benefit from continued skilled therapy in order to reach goals and maximize functional LE strength and stability for prevention of further functional decline.   OBJECTIVE IMPAIRMENTS: Abnormal gait, decreased activity tolerance, decreased balance, decreased coordination, decreased endurance, decreased mobility, difficulty walking, decreased ROM, decreased strength, hypomobility, impaired flexibility, postural dysfunction, and pain.   ACTIVITY LIMITATIONS: standing, stairs, transfers, bed mobility, dressing, and locomotion level  PARTICIPATION LIMITATIONS: community activity and workout activities  PERSONAL FACTORS: 1 comorbidity: [name] and Progression of sx's and mobility deficits  are also affecting patient's functional outcome.   REHAB POTENTIAL: Fair due to Freidreich's ataxia, progression of sx's, and mobility deficits  CLINICAL DECISION MAKING: Evolving/moderate complexity  EVALUATION COMPLEXITY: Moderate   GOALS:  SHORT TERM GOALS: Target date:  11/22/2022   Pt will be able to actively perform eversion for improved ankle position and strength.  Baseline: Goal status: INITIAL  2.  Pt will demo improved positioning of L ankle in W/C based upon visual observation for improved activation of peroneals and proper length tension  relationship for ankle mm for improved stability.   Baseline:  Goal status: INITIAL  3.  Pt will demo improved ankle DF AROM to neutral for improved ankle mobility.  Baseline:  Goal status: INITIAL  4.  Pt will report at least a 25% improvement in mobility.  Baseline:  Goal status: INITIAL Target date: 11/29/2022   LONG TERM GOALS: Target date: 12/12/2022  Pt will report she is able to perform transfers at home with grab bars independently.   Baseline:  Goal status: INITIAL  2.  Pt will be able to perform transfers in the clinic with min assist.   Baseline:  Goal status: INITIAL  3.  Pt will demo improved L ankle AROM to 5-10 deg in DF and 5 deg in Eve for improved ankle ROM and positioning with mobility.  Baseline:  Goal status: INITIAL  4.  Pt will be able to tolerate 4-5 mins of standing with min assist with UE support for improved tolerance with mobility and activities around her home.  Baseline:  Goal status: INITIAL  5.  Pt will be able to tolerate minimal to moderate manual resistance in L ankle DF and Eve for improved tolerance with Wb'ing and performance of transfers and ambulation.  Baseline:  Goal status: INITIAL    PLAN:  PT FREQUENCY: 1-2x/week  PT DURATION: 6 weeks  PLANNED INTERVENTIONS: Therapeutic exercises, Therapeutic activity, Neuromuscular re-education, Balance training, Gait training, Patient/Family education, Self Care, Joint mobilization, DME instructions, Aquatic Therapy, Dry Needling, Electrical stimulation, Cryotherapy, Moist heat, Taping, Manual therapy, and Re-evaluation  PLAN FOR NEXT SESSION:  Pt is a FALL RISK.  Pt requires max assist +2 to perform transfer. Use a gait belt.  Ankle DF AROM and eve PROM.  L quad and hip strength; use lift at pool   Daleen Bo PT, DPT 01/30/23 2:47 PM

## 2023-02-01 ENCOUNTER — Encounter (HOSPITAL_BASED_OUTPATIENT_CLINIC_OR_DEPARTMENT_OTHER): Payer: Self-pay | Admitting: Physical Therapy

## 2023-02-01 ENCOUNTER — Ambulatory Visit (HOSPITAL_BASED_OUTPATIENT_CLINIC_OR_DEPARTMENT_OTHER): Payer: Medicare HMO | Admitting: Physical Therapy

## 2023-02-01 DIAGNOSIS — M25572 Pain in left ankle and joints of left foot: Secondary | ICD-10-CM | POA: Diagnosis not present

## 2023-02-01 DIAGNOSIS — R2689 Other abnormalities of gait and mobility: Secondary | ICD-10-CM | POA: Diagnosis not present

## 2023-02-01 DIAGNOSIS — M25672 Stiffness of left ankle, not elsewhere classified: Secondary | ICD-10-CM

## 2023-02-01 DIAGNOSIS — M6281 Muscle weakness (generalized): Secondary | ICD-10-CM | POA: Diagnosis not present

## 2023-02-01 NOTE — Therapy (Signed)
OUTPATIENT PHYSICAL THERAPY TREATMENT NOTE   Patient Name: Robin Glenn MRN: [id number] DOB:07/20/1991, 32 y.o., female Today's Date: 02/01/2023  END OF SESSION:  PT End of Session - 02/01/23 1032     Visit Number 18    Number of Visits 36    Date for PT Re-Evaluation 02/27/23    Authorization Type AET[employer] MCR    PT Start Time 0900    PT Stop Time 0945    PT Time Calculation (min) 45 min    Equipment Utilized During Treatment Gait belt    Activity Tolerance Patient tolerated treatment well    Behavior During Therapy WFL for tasks assessed/performed               Past Medical History:  Diagnosis Date   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 No pertinent surgical history. 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    Pt has had no pain since last session. Feeling well  Previous:  Pt's boyfriend states she hasn't used her rollator in 6 months.  Pt states they put weights in rollator for improved stability.   Pt has increased difficulty with performing transfers currently and requires assistance with transfers including in shower at home.  Pt has difficulty with pulling pants up.  She has difficulty with foot staying on pedal due to moving inward.  Pt has  increased difficulty with lifting L LE on pedal and c/o's of L LE weakness.  Pt works out with a Physiological scientist 3x/wk though is more limited with mobilty and with activities.    PERTINENT HISTORY: [name]--pt is in a W/C.  She requires maximum assist with transfers.  Pt required assistance with sitting.   Pt is a FALL RISK.  Use a gait belt.    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  0/10 Current and best, 6/10 worst L ankle  PRECAUTIONS: Fall and Other: Requires extensive assistance with transfers  WEIGHT BEARING RESTRICTIONS: No  FALLS:  Has patient fallen in last 6 months? Yes. Number of falls 1 with transfer  LIVING ENVIRONMENT: Lives with: lives with their family Lives in: House/apartment Stairs: 2 story home Has following equipment at home: stair lift, grab bars, Rollator  OCCUPATION: Part time job as a Sales promotion account executive.    PLOF: Needs assistance with ADLs  Family assist with ADLs.  Pt in W/C.  Pt was able to walk a few mins with her personal trainer holding her up her prior to June.  Pt able to perform transfers independently at home with grab bars and required assistance out in the community.  Pt was able to take a few steps with rollator at home.    PATIENT GOALS: to improve strength of ankle, increase Wb'ing on L ankle, perform mobility  without brace, walk a couple of mins on TM. 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  L ankle MRI on 10/02/2022: IMPRESSION: 1. Mild tenosynovitis of the peroneal tendons. 2. Ligaments of the medial and lateral ankle ligaments are intact. 3. Small ankle joint effusion. 4. No evidence of fracture or osteonecrosis. 5. Mild edema of the K[place]'s fat pad. No fluid collection or hematoma.   FOTO 1/30 49pts  TODAY'S  TREATMENT:  02/01/23 Aquatic: Pt seen for aquatic therapy today.  Treatment took place in water 3.25-4.5 ft in depth at the [facility]. Temp of water was 91.  Pt entered/exited the pool via lift. Boy friend assisting.  Max assist to and from wc and lift (boyfriend).   *supported by noodle and therapist/boyfriend 7.5 Lb weigth lle; 5 lb weight rle.  Standing in  4.3 ft ue support on wall; then 3.8. Trialed UE support on barbell then weighted down reg walker -with assistance weight bearing through bilat LE. Weight shifting laterally then forward back.  -working to gain foot flat bilaterally *Suspended supine with yellow noodle and supported by therapist:  hamstring curl, hip extension; hip flex *vertical suspension noodle wrapped across chest and 2nd across back: hip add/abd: cycling with focus on reciprocal movements *Using reg walker: gait training, focus on reciprocal movement   Pt requires the buoyancy and hydrostatic pressure of water for support, and to offload joints by unweighting joint load by at least 50 % in navel deep water and by at least 75-80% in chest to neck deep water.  Viscosity of the water is needed for resistance of strengthening. Water current perturbations provides challenge to standing balance requiring increased core activation.     3/19  Boyfriend available during session for dependent transfers to table   Manually assisted gastroc stretching for L LE  AAROM Ankle 2x10 DF EV Seated hip march 3x10 Seated hip ABD 2x10 LAQ 3x10 Seated L LE manual resisted leg press 3x10 "Barstool" height, knee extension/standing with boyfriend controlling trunk and PT setting ankle 3x10   01/25/23/24 Aquatic: Pt seen for aquatic therapy today.  Treatment took place in water 3.25-4.5 ft in depth at the [facility]. Temp of water was 91.  Pt entered/exited the pool via lift. Boy friend assisting.  Max assist to and from wc and lift  (boyfriend).   *supported by noodle and therapist/boyfriend 7.5 Lb weigth lle; 5 lb weight rle.  Standing in  4.3 ft ue support on wall; then 3.8 -with assistance weight bearing through bilat LE. Weight shifting laterally then forward back.  -working to gain foot flat bilaterally *Suspended supine with yellow noodle and supported by therapist: add/abd; hamstring curl, hip extension; hip flex *vertical suspension noodle wrapped across chest and 2nd across back: pt propelling as able through 4.57ft *STS from bench. Max assist x 2 for positioning.  Pt rises with min assist x5. Complete SL STS left x5    Pt requires the buoyancy and hydrostatic pressure of water for support, and to offload joints by unweighting joint load by at least 50 % in navel deep water and by at least 75-80% in chest to neck deep water.  Viscosity of the water is needed for resistance of strengthening. Water current perturbations provides challenge to standing balance requiring increased core activation.     2/29  Boyfriend available during session for dependent transfers to  table   Manually assisted gastroc stretching for L LE  AAROM Ankle 2x10 DF EV Seated L LE manual resisted leg press 3x10 "Barstool" height, knee extension/standing with boyfriend controlling trunk and PT setting ankle Supine bridge with manual assisted for foot blocking and knee control 3x10    01/02/23 Aquatic: Pt seen for aquatic therapy today.  Treatment took place in water 3.25-4.5 ft in depth at the [facility]. Temp of water was 91.  Pt entered/exited the pool via lift. Boy friend assisting.  Max assist to and from wc and lift (boyfriend).   *supported by noodle and therapist/boyfriend 7.5 Lb weigth lle; 5 lb weight rle.  Standing in  4.0 ft ue support on wall -with assistance weight bearing through bilat LE. Weight shifting laterally then forward back.  -side stepping ue support on wall R/L max-mod assist for placement  of LE. *Gait training: encouraged reciprocal le movements LE walking across pool with max-mod assist for advancement and placement of LE *Suspended supine with yellow noodle and supported by therapist: add/abd *vertical suspension noodle wrapped across chest and 2nd across back: pt propelling as able through 4.93ft: cues for marching   Pt requires the buoyancy and hydrostatic pressure of water for support, and to offload joints by unweighting joint load by at least 50 % in navel deep water and by at least 75-80% in chest to neck deep water.  Viscosity of the water is needed for resistance of strengthening. Water current perturbations provides challenge to standing balance requiring increased core activation.     12/14/22 Aquatic: Pt seen for aquatic therapy today.  Treatment took place in water 3.25-4.5 ft in depth at the [place] [facility]. Temp of water was 91.  Pt entered/exited the pool via lift. Boy friend assisting.  Max assist to and from wc and lift (boyfriend).     *supported by noodle and therapist/boyfriend 7.5 Lb weigth lle; 5 lb weight rle. Standing in  4.0 ft leaning against wall then hha -with assistance  pt standing weight bearing through bilat LE. Weight shifting. Pt requires max -mod assist to gain position.  Better control of le in setting   -Cues for left foot flat which pt able to complete indep/therapist manually assisting position to remain in place on floor manual added wb through pt's hips for weight shifting R/L. Pt able to shift R and Left requiring ~50% assistance *Gait training: encouraged reciprocal le movements LE walking across pool with max-mod assist for advancement and placement of LE *Suspended supine with yellow noodle and supported by therapist: add/abd; knees to chest; modified sit up *suspended prone: for post core isometric (pt did not tolerate well)   Pt requires the buoyancy and hydrostatic pressure of water for support, and to offload joints by  unweighting joint load by at least 50 % in navel deep water and by at least 75-80% in chest to neck deep water.  Viscosity of the water is needed for resistance of strengthening. Water current perturbations provides challenge to standing balance requiring increased core activation.  1/30 Boyfriend available during session for dependent transfers to mat table   Manually assisted gastroc stretching for L LE  Ankle isometrics 3s 10x DF, IV, EV LAQ 2lb 3x10 S/L clam with assist and without 3x5 and then 2x10 as contraction improved L LE leg manually resisted leg press 3x10;  Supine bridge with ADD 3x10 Ball ADD squeeze 3x10 Half turns/single leg bridge to roll 3x10 each   PATIENT EDUCATION:  Education  details: anatomy, exercise progression,  muscle firing,  HEP and HEP safety, POC  Person educated:  Pt and boyfriend Education method: Explanation, Demonstration, Tactile cues, and Verbal cues Education comprehension: verbalized understanding, returned demonstration, verbal cues required, tactile cues required, and needs further education  HOME EXERCISE PROGRAM: Access Code: [email or web address] URL: [email or web address] Date: 11/14/2022 Prepared by: Daleen Bo  Exercises - Supine Ankle Circles  - 1 x daily - 7 x weekly - 2 sets - 10 reps - Supine Ankle Dorsiflexion and Plantarflexion AROM  - 1 x daily - 7 x weekly - 2 sets - 10 reps - Seated Hip Abduction with Resistance  - 1 x daily - 7 x weekly - 3 sets - 10 reps - Standing Weight Shift  - 1 x daily - 7 x weekly - 1 sets - 1 reps - 5 min total hold  ASSESSMENT:  CLINICAL IMPRESSION: Progressed standing with increased ease gaining foot flat. She is able to maintain position in 4 ft x 5 minutes with assistance of therapist and BF keeping her submerged. Trialed some amb/reciprocal LE movements using fww weighted and submerged in 4 ft. Left ankle inverts with each step although she was successful advancing across pool (x3). May try to  add additional weighting on pt for approximation purposes. Glute engagement palpable with hip extension in sup.Pt would benefit from continued skilled therapy in order to reach goals and maximize functional LE strength and stability for prevention of further functional decline.    OBJECTIVE IMPAIRMENTS: Abnormal gait, decreased activity tolerance, decreased balance, decreased coordination, decreased endurance, decreased mobility, difficulty walking, decreased ROM, decreased strength, hypomobility, impaired flexibility, postural dysfunction, and pain.   ACTIVITY LIMITATIONS: standing, stairs, transfers, bed mobility, dressing, and locomotion level  PARTICIPATION LIMITATIONS: community activity and workout activities  PERSONAL FACTORS: 1 comorbidity: [name] and Progression of sx's and mobility deficits  are also affecting patient's functional outcome.   REHAB POTENTIAL: Fair due to Freidreich's ataxia, progression of sx's, and mobility deficits  CLINICAL DECISION MAKING: Evolving/moderate complexity  EVALUATION COMPLEXITY: Moderate   GOALS:  SHORT TERM GOALS: Target date:  11/22/2022   Pt will be able to actively perform eversion for improved ankle position and strength.  Baseline: Goal status: INITIAL  2.  Pt will demo improved positioning of L ankle in W/C based upon visual observation for improved activation of peroneals and proper length tension relationship for ankle mm for improved stability.   Baseline:  Goal status: INITIAL  3.  Pt will demo improved ankle DF AROM to neutral for improved ankle mobility.  Baseline:  Goal status: INITIAL  4.  Pt will report at least a 25% improvement in mobility.  Baseline:  Goal status: INITIAL Target date: 11/29/2022   LONG TERM GOALS: Target date: 12/12/2022  Pt will report she is able to perform transfers at home with grab bars independently.   Baseline:  Goal status: INITIAL  2.  Pt will be able to perform transfers  in the clinic with min assist.   Baseline:  Goal status: INITIAL  3.  Pt will demo improved L ankle AROM to 5-10 deg in DF and 5 deg in Eve for improved ankle ROM and positioning with mobility.  Baseline:  Goal status: INITIAL  4.  Pt will be able to tolerate 4-5 mins of standing with min assist with UE support for improved tolerance with mobility and activities around her home.  Baseline:  Goal status: INITIAL  5.  Pt will be able to tolerate  minimal to moderate manual resistance in L ankle DF and Eve for improved tolerance with Wb'ing and performance of transfers and ambulation.  Baseline:  Goal status: INITIAL    PLAN:  PT FREQUENCY: 1-2x/week  PT DURATION: 6 weeks  PLANNED INTERVENTIONS: Therapeutic exercises, Therapeutic activity, Neuromuscular re-education, Balance training, Gait training, Patient/Family education, Self Care, Joint mobilization, DME instructions, Aquatic Therapy, Dry Needling, Electrical stimulation, Cryotherapy, Moist heat, Taping, Manual therapy, and Re-evaluation  PLAN FOR NEXT SESSION:  Pt is a FALL RISK.  Pt requires max assist +2 to perform transfer. Use a gait belt.  Ankle DF AROM and eve PROM.  L quad and hip strength; use lift at pool   [facility]) Stellan Vick MPT 02/01/23 1:26 PM

## 2023-02-06 ENCOUNTER — Ambulatory Visit (HOSPITAL_BASED_OUTPATIENT_CLINIC_OR_DEPARTMENT_OTHER): Payer: Medicare HMO | Admitting: Physical Therapy

## 2023-02-08 ENCOUNTER — Encounter (HOSPITAL_BASED_OUTPATIENT_CLINIC_OR_DEPARTMENT_OTHER): Payer: Self-pay | Admitting: Physical Therapy

## 2023-02-08 ENCOUNTER — Ambulatory Visit (HOSPITAL_BASED_OUTPATIENT_CLINIC_OR_DEPARTMENT_OTHER): Payer: Medicare HMO | Admitting: Physical Therapy

## 2023-02-08 DIAGNOSIS — M6281 Muscle weakness (generalized): Secondary | ICD-10-CM | POA: Diagnosis not present

## 2023-02-08 DIAGNOSIS — R2689 Other abnormalities of gait and mobility: Secondary | ICD-10-CM

## 2023-02-08 DIAGNOSIS — M25672 Stiffness of left ankle, not elsewhere classified: Secondary | ICD-10-CM | POA: Diagnosis not present

## 2023-02-08 DIAGNOSIS — M25572 Pain in left ankle and joints of left foot: Secondary | ICD-10-CM | POA: Diagnosis not present

## 2023-02-08 NOTE — Therapy (Signed)
OUTPATIENT PHYSICAL THERAPY TREATMENT NOTE   Patient Name: Robin Glenn MRN: [id number] DOB:09/19/1991, 32 y.o., female Today's Date: 02/08/2023  END OF SESSION:  PT End of Session - 02/08/23 1057     Visit Number 19    Number of Visits 36    Date for PT Re-Evaluation 02/27/23    Authorization Type AET[employer] MCR    PT Start Time 0900    PT Stop Time 0945    PT Time Calculation (min) 45 min    Equipment Utilized During Treatment Gait belt    Activity Tolerance Patient tolerated treatment well    Behavior During Therapy WFL for tasks assessed/performed               Past Medical History:  Diagnosis Date   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 No pertinent surgical history. 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    Pt has had no pain since last session. "Got a pair of water shoes to see if I could stand better   Pt has increased difficulty with performing transfers currently and requires assistance with transfers including in shower at home.  Pt has difficulty with pulling pants up.  She has difficulty with foot staying on pedal due to moving inward.  Pt has increased difficulty with lifting L LE on pedal and c/o's of L LE weakness.  Pt works out with a Physiological scientist 3x/wk though is more limited  with mobilty and with activities.    PERTINENT HISTORY: [name]--pt is in a W/C.  She requires maximum assist with transfers.  Pt required assistance with sitting.   Pt is a FALL RISK.  Use a gait belt.    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  0/10 Current and best, 6/10 worst L ankle  PRECAUTIONS: Fall and Other: Requires extensive assistance with transfers  WEIGHT BEARING RESTRICTIONS: No  FALLS:  Has patient fallen in last 6 months? Yes. Number of falls 1 with transfer  LIVING ENVIRONMENT: Lives with: lives with their family Lives in: House/apartment Stairs: 2 story home Has following equipment at home: stair lift, grab bars, Rollator  OCCUPATION: Part time job as a Sales promotion account executive.    PLOF: Needs assistance with ADLs  Family assist with ADLs.  Pt in W/C.  Pt was able to walk a few mins with her personal trainer holding her up her prior to June.  Pt able to perform transfers independently at home with grab bars and required assistance out in the community.  Pt was able to take a few steps with rollator at home.    PATIENT GOALS: to improve strength of ankle, increase Wb'ing on L ankle, perform mobility without brace, walk a couple of mins on TM. "I have [name], so exercise is very important for me"    OBJECTIVE:  DIAGNOSTIC FINDINGS:  X rays of L ankle:  No fracture.  L ankle MRI on 10/02/2022: IMPRESSION: 1. Mild tenosynovitis of the peroneal tendons. 2. Ligaments of the medial and lateral ankle ligaments are intact. 3. Small ankle joint effusion. 4. No evidence of fracture or osteonecrosis. 5. Mild edema of the Kager's fat pad. No fluid collection or hematoma.   FOTO 1/30 49pts  TODAY'S TREATMENT:  02/01/23 Aquatic: Pt seen for aquatic therapy today.  Treatment took place in water 3.25-4.5 ft in depth at the [facility]. Temp of water was  91.  Pt entered/exited the pool via lift. Boy friend assisting.  Max assist to and from wc and lift (boyfriend).   *supported by noodle and therapist/boyfriend 7.5 Lb weigth lle; 5 lb weight rle AND water shoes Standing in  4.3 ft ue support on wall; then 3.8. Able to wb through lle x~5 minutes in each depth continuously    -with assistance weight bearing through bilat LE. Weight shifting laterally. *Suspended supine with yellow noodle and supported by therapist LE strengthening:  hamstring curl, hip extension; hip flex; add/abd  - core engagement: knees to chest x 10 *vertical suspension noodle wrapped across chest and 2nd across back: hip add/abd: cycling with focus on reciprocal movements *HHA x 2: gait training: reciprocal LE movement   Pt requires the buoyancy and hydrostatic pressure of water for support, and to offload joints by unweighting joint load by at least 50 % in navel deep water and by at least 75-80% in chest to neck deep water.  Viscosity of the water is needed for resistance of strengthening. Water current perturbations provides challenge to standing balance requiring increased core activation.     3/19  Boyfriend available during session for dependent transfers to table   Manually assisted gastroc stretching for L LE  AAROM Ankle 2x10 DF EV Seated hip march 3x10 Seated hip ABD 2x10 LAQ 3x10 Seated L LE manual resisted leg press 3x10 "Barstool" height, knee extension/standing with boyfriend controlling trunk and PT setting ankle 3x10   01/25/23/24 Aquatic: Pt seen for aquatic therapy today.  Treatment took place in water 3.25-4.5 ft in depth at the [facility]. Temp of water was 91.  Pt entered/exited the pool via lift. Boy friend assisting.  Max assist to and from wc and lift (boyfriend).   *supported by noodle and therapist/boyfriend 7.5 Lb weigth lle; 5 lb weight rle.  Standing in  4.3 ft ue support on wall; then 3.8 -with assistance weight  bearing through bilat LE. Weight shifting laterally then forward back.  -working to gain foot flat bilaterally *Suspended supine with yellow noodle and supported by therapist: add/abd; hamstring curl, hip extension; hip flex *vertical suspension noodle wrapped across chest and 2nd across back: pt propelling as able through 4.37ft *STS from bench. Max assist x 2 for positioning.  Pt rises with min assist x5. Complete SL STS left x5    Pt requires the buoyancy and hydrostatic pressure of water for support, and to offload joints by unweighting joint load by at least 50 % in navel deep water and by at least 75-80% in chest to neck deep water.  Viscosity of the water is needed for resistance of strengthening. Water current perturbations provides challenge to standing balance requiring increased core activation.     2/29  Boyfriend available during session for dependent transfers to table   Manually assisted gastroc stretching for L LE  AAROM Ankle 2x10 DF EV Seated L LE  manual resisted leg press 3x10 "Barstool" height, knee extension/standing with boyfriend controlling trunk and PT setting ankle Supine bridge with manual assisted for foot blocking and knee control 3x10    01/02/23 Aquatic: Pt seen for aquatic therapy today.  Treatment took place in water 3.25-4.5 ft in depth at the [facility]. Temp of water was 91.  Pt entered/exited the pool via lift. Boy friend assisting.  Max assist to and from wc and lift (boyfriend).   *supported by noodle and therapist/boyfriend 7.5 Lb weigth lle; 5 lb weight rle.  Standing in  4.0 ft ue support on wall -with assistance weight bearing through bilat LE. Weight shifting laterally then forward back.  -side stepping ue support on wall R/L max-mod assist for placement of LE. *Gait training: encouraged reciprocal le movements LE walking across pool with max-mod assist for advancement and placement of LE *Suspended supine with yellow noodle  and supported by therapist: add/abd *vertical suspension noodle wrapped across chest and 2nd across back: pt propelling as able through 4.36ft: cues for marching   Pt requires the buoyancy and hydrostatic pressure of water for support, and to offload joints by unweighting joint load by at least 50 % in navel deep water and by at least 75-80% in chest to neck deep water.  Viscosity of the water is needed for resistance of strengthening. Water current perturbations provides challenge to standing balance requiring increased core activation.     12/14/22 Aquatic: Pt seen for aquatic therapy today.  Treatment took place in water 3.25-4.5 ft in depth at the [facility]. Temp of water was 91.  Pt entered/exited the pool via lift. Boy friend assisting.  Max assist to and from wc and lift (boyfriend).     *supported by noodle and therapist/boyfriend 7.5 Lb weigth lle; 5 lb weight rle. Standing in  4.0 ft leaning against wall then hha -with assistance  pt standing weight bearing through bilat LE. Weight shifting. Pt requires max -mod assist to gain position.  Better control of le in setting   -Cues for left foot flat which pt able to complete indep/therapist manually assisting position to remain in place on floor manual added wb through pt's hips for weight shifting R/L. Pt able to shift R and Left requiring ~50% assistance *Gait training: encouraged reciprocal le movements LE walking across pool with max-mod assist for advancement and placement of LE *Suspended supine with yellow noodle and supported by therapist: add/abd; knees to chest; modified sit up *suspended prone: for post core isometric (pt did not tolerate well)   Pt requires the buoyancy and hydrostatic pressure of water for support, and to offload joints by unweighting joint load by at least 50 % in navel deep water and by at least 75-80% in chest to neck deep water.  Viscosity of the water is needed for resistance of  strengthening. Water current perturbations provides challenge to standing balance requiring increased core activation.  1/30 Boyfriend available during session for dependent transfers to mat table   Manually assisted gastroc stretching for L LE  Ankle isometrics 3s 10x DF, IV, EV LAQ 2lb 3x10 S/L clam with assist and without 3x5 and then 2x10 as contraction improved L LE leg manually resisted leg press 3x10;  Supine bridge with ADD 3x10 Ball ADD squeeze 3x10 Half turns/single leg bridge to roll 3x10 each   PATIENT EDUCATION:  Education details: anatomy, exercise progression,  muscle firing,  HEP and HEP safety, POC  Person educated:  Pt and  boyfriend Education method: Explanation, Demonstration, Tactile cues, and Verbal cues Education comprehension: verbalized understanding, returned demonstration, verbal cues required, tactile cues required, and needs further education  HOME EXERCISE PROGRAM: Access Code: [email or web address] URL: [email or web address] Date: 11/14/2022 Prepared by: Daleen Bo  Exercises - Supine Ankle Circles  - 1 x daily - 7 x weekly - 2 sets - 10 reps - Supine Ankle Dorsiflexion and Plantarflexion AROM  - 1 x daily - 7 x weekly - 2 sets - 10 reps - Seated Hip Abduction with Resistance  - 1 x daily - 7 x weekly - 3 sets - 10 reps - Standing Weight Shift  - 1 x daily - 7 x weekly - 1 sets - 1 reps - 5 min total hold  ASSESSMENT:  CLINICAL IMPRESSION: Water shoes made a very good difference with standing submerged.  The traction assisted with keeping feet placed on bottom of pool. Less difficulty with maintaining feet flat (not inverted) today allowing for increased time with wb through LLE particularly.  Gained good abdominal engagement with cycling and add/abd in angled position. Of note: Hip add with >weakness then abd.  She uses hip flex and abdomin ms substitution.  Requires vc for breathing      OBJECTIVE IMPAIRMENTS: Abnormal gait, decreased  activity tolerance, decreased balance, decreased coordination, decreased endurance, decreased mobility, difficulty walking, decreased ROM, decreased strength, hypomobility, impaired flexibility, postural dysfunction, and pain.   ACTIVITY LIMITATIONS: standing, stairs, transfers, bed mobility, dressing, and locomotion level  PARTICIPATION LIMITATIONS: community activity and workout activities  PERSONAL FACTORS: 1 comorbidity: [name] and Progression of sx's and mobility deficits  are also affecting patient's functional outcome.   REHAB POTENTIAL: Fair due to Freidreich's ataxia, progression of sx's, and mobility deficits  CLINICAL DECISION MAKING: Evolving/moderate complexity  EVALUATION COMPLEXITY: Moderate   GOALS:  SHORT TERM GOALS: Target date:  11/22/2022   Pt will be able to actively perform eversion for improved ankle position and strength.  Baseline: Goal status: INITIAL  2.  Pt will demo improved positioning of L ankle in W/C based upon visual observation for improved activation of peroneals and proper length tension relationship for ankle mm for improved stability.   Baseline:  Goal status: INITIAL  3.  Pt will demo improved ankle DF AROM to neutral for improved ankle mobility.  Baseline:  Goal status: INITIAL  4.  Pt will report at least a 25% improvement in mobility.  Baseline:  Goal status: INITIAL Target date: 11/29/2022   LONG TERM GOALS: Target date: 12/12/2022  Pt will report she is able to perform transfers at home with grab bars independently.   Baseline:  Goal status: INITIAL  2.  Pt will be able to perform transfers in the clinic with min assist.   Baseline:  Goal status: INITIAL  3.  Pt will demo improved L ankle AROM to 5-10 deg in DF and 5 deg in Eve for improved ankle ROM and positioning with mobility.  Baseline:  Goal status: INITIAL  4.  Pt will be able to tolerate 4-5 mins of standing with min assist with UE support for improved  tolerance with mobility and activities around her home.  Baseline:  Goal status: INITIAL  5.  Pt will be able to tolerate minimal to moderate manual resistance in L ankle DF and Eve for improved tolerance with Wb'ing and performance of transfers and ambulation.  Baseline:  Goal status: INITIAL    PLAN:  PT FREQUENCY: 1-2x/week  PT DURATION: 6 weeks  PLANNED INTERVENTIONS: Therapeutic exercises, Therapeutic activity, Neuromuscular re-education, Balance training, Gait training, Patient/Family education, Self Care, Joint mobilization, DME instructions, Aquatic Therapy, Dry Needling, Electrical stimulation, Cryotherapy, Moist heat, Taping, Manual therapy, and Re-evaluation  PLAN FOR NEXT SESSION:  Pt is a FALL RISK.  Pt requires max assist +2 to perform transfer. Use a gait belt.  Ankle DF AROM and eve PROM.  L quad and hip strength; use lift at pool   [facility]) Robin Glenn MPT 02/08/23 11:10 AM

## 2023-02-13 ENCOUNTER — Ambulatory Visit (HOSPITAL_BASED_OUTPATIENT_CLINIC_OR_DEPARTMENT_OTHER): Payer: Medicare HMO | Attending: Neurology | Admitting: Physical Therapy

## 2023-02-13 DIAGNOSIS — M6281 Muscle weakness (generalized): Secondary | ICD-10-CM | POA: Diagnosis not present

## 2023-02-13 DIAGNOSIS — M25572 Pain in left ankle and joints of left foot: Secondary | ICD-10-CM | POA: Insufficient documentation

## 2023-02-13 DIAGNOSIS — M25672 Stiffness of left ankle, not elsewhere classified: Secondary | ICD-10-CM | POA: Diagnosis not present

## 2023-02-13 DIAGNOSIS — R2689 Other abnormalities of gait and mobility: Secondary | ICD-10-CM | POA: Diagnosis not present

## 2023-02-13 NOTE — Therapy (Signed)
OUTPATIENT PHYSICAL THERAPY TREATMENT NOTE / PROGRESS NOTE  Progress Note Reporting Period 12/07/22 to 02/13/23   See note below for Objective Data and Assessment of Progress/Goals.    Patient Name: Robin Glenn MRN: [id number] DOB:04/06/1991, 32 y.o., female Today's Date: 02/14/2023  END OF SESSION:  PT End of Session - 02/13/23 1509     Visit Number 20    Number of Visits 36    Date for PT Re-Evaluation 04/10/23    Authorization Type [employer] MCR    PT Start Time 1415    PT Stop Time 1454    PT Time Calculation (min) 39 min    Activity Tolerance Patient tolerated treatment well    Behavior During Therapy WFL for tasks assessed/performed               Past Medical History:  Diagnosis Date   [name]    History reviewed. No pertinent surgical history. 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    Pt is able to take a couple of steps when her arms are around boyfriend and he is supporting her.  She is unable to walk on TM with trainer like she used to.  Pt states she is making some progress in the pool.  She is improving with standing and applying weight thru L LE in the pool.  She is unable to ambulate in the pool.  Pt has water shoes now, and feels that helps with  standing in the pool.  Pt reports 50% improvement in mobility overall.  Pt is able to position foot on pedal of W/C easier.  She is able to lift L LE easier to move her foot on her foot pedal on W/C.  Her natural tendency for the foot is to turn inward.   Pt has increased difficulty with performing transfers and requires assistance with transfers including in shower at home.  Pt has difficulty with pulling pants up.  Pt reports she is able to stand approx 3 mins with assistance in the pool.  Pt is able to stand 30 sec to 1 min in the gym with assistance.     PERTINENT HISTORY: [name]--pt is in a W/C.  She requires maximum assist with transfers.  Pt required assistance with sitting.   Pt is a FALL RISK.  Use a gait belt.    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  1/10 Current, 0/10 best, 3/10 worst L ankle  PRECAUTIONS: Fall and Other: Requires extensive assistance with transfers  WEIGHT BEARING RESTRICTIONS: No  FALLS:  Has patient fallen in last 6 months? Yes. Number of falls 1 with transfer  LIVING ENVIRONMENT: Lives with: lives with their family Lives in: House/apartment Stairs: 2 story home Has following equipment at home: stair lift, grab bars, Rollator  OCCUPATION: Part time  job as a Sales promotion account executive.    PLOF: Needs assistance with ADLs  Family assist with ADLs.  Pt in W/C.  Pt was able to walk a few mins with her personal trainer holding her up her prior to June.  Pt able to perform transfers independently at home with grab bars and required assistance out in the community.  Pt was able to take a few steps with rollator at home.    PATIENT GOALS: to improve strength of ankle, increase Wb'ing on L ankle, perform mobility without brace, walk a couple of mins on TM. "I have [name], so exercise is very important for me"    OBJECTIVE:   DIAGNOSTIC FINDINGS:  X rays of L  ankle:  No fracture.  L ankle MRI on 10/02/2022: IMPRESSION: 1. Mild tenosynovitis of the peroneal tendons. 2. Ligaments of the medial and lateral ankle ligaments are intact. 3. Small ankle joint effusion. 4. No evidence of fracture or osteonecrosis. 5. Mild edema of the Kager's fat pad. No fluid collection or hematoma.     TODAY'S TREATMENT:  FOTO:  Prior / Current:  71 / 38 with a goal of 62.  POSTURE: Pt sits in W/C with L ankle in very mild inversion and PF on pedal.  Pt's L foot able to reach foot flat in standing       LOWER EXTREMITY ROM:    ROM Left eval Left 1/23 Left 4/2  Hip flexion       Hip extension       Hip abduction       Hip adduction       Hip internal rotation       Hip external rotation       Knee flexion       Knee extension       Ankle dorsiflexion 11 deg from neutral  0 Lacking 1-2 deg from neutral  Ankle plantarflexion       Ankle inversion 31  30 31   Ankle eversion  Pt unable to perform eversion; PROM: 4  5 deg with multiple attempts Multiple attempts 2 deg ; PROM: 9   (Blank rows = not tested)   LOWER EXTREMITY MMT:   MMT Right eval Left eval  Ankle eversion   Unable to tolerate resistance   (Blank rows = not tested)  FUNCTIONAL TESTS:  Pt required maximum assistance from boyfriend to perform transfer from W/C to table.   -MaxA of 1 for STS transfer  -able to stand with foot flat position when supported by caregiver and PT assistance at foot/ankle   Boyfriend available during session for dependent transfers to table   Pt performed: AAROM Ankle 2x10 EV LAQ 2x10 "Barstool" height, knee extension/standing with boyfriend controlling trunk and PT setting ankle x 5 reps  Pt stood with boyfriend controlling trunk and PT positioning ankle and LE     PATIENT EDUCATION:  Education details: anatomy, exercise progression,  muscle firing,  HEP and HEP safety, POC Person educated:  Pt and boyfriend Education method: Explanation,  Demonstration, Tactile cues, and Verbal cues Education comprehension: verbalized understanding, returned demonstration, verbal cues required, tactile cues required, and needs further education  HOME EXERCISE PROGRAM: Access Code: [email or web address] URL: [email or web address] Date: 11/14/2022 Prepared by: Daleen Bo  Exercises - Supine Ankle Circles  - 1 x daily - 7 x weekly - 2 sets - 10 reps - Supine Ankle Dorsiflexion and Plantarflexion AROM  - 1 x daily - 7 x weekly -  2 sets - 10 reps - Seated Hip Abduction with Resistance  - 1 x daily - 7 x weekly - 3 sets - 10 reps - Standing Weight Shift  - 1 x daily - 7 x weekly - 1 sets - 1 reps - 5 min total hold  ASSESSMENT:  CLINICAL IMPRESSION: Pt is making progress in PT.  She reports 50% improvement in mobility overall and her worst pain has improved from 6/10 to 3/10.  Pt states she is able to take a couple of steps with her boyfriend supporting her and providing much assistance though is unable to walk on the TM with trainer like she used to.  Pt is able to position foot on pedal of W/C easier.  She is able to lift L LE easier to move her foot on her foot pedal on W/C.  Her foot still inverts though has improved.  Pt continues to have significant difficulty with performing transfers and requires extensive assistance with transfers including in shower at home.  Pt has minimally worse L ankle AROM compared to prior PN, though has improved AROM compared to initial eval.  It does require multiple attempts to perform eversion with cuing though pt is able to perform which is an improvement.  She is however unable to tolerate any resistance in eversion.  Pt states she is making some progress in the pool including improvement with standing and applying weight thru L LE in the pool.  Pt demonstrated improved self perceived disability with FOTO score improving from 49 to 57.  Pt has made minimal progress toward goals since prior PN.  Pt may benefit from  cont skilled PT services to address ongoing goals and improve ROM, strength, function, and mobility.     OBJECTIVE IMPAIRMENTS: Abnormal gait, decreased activity tolerance, decreased balance, decreased coordination, decreased endurance, decreased mobility, difficulty walking, decreased ROM, decreased strength, hypomobility, impaired flexibility, postural dysfunction, and pain.   ACTIVITY LIMITATIONS: standing, stairs, transfers, bed mobility, dressing, and locomotion level  PARTICIPATION LIMITATIONS: community activity and workout activities  PERSONAL FACTORS: 1 comorbidity: [name] and Progression of sx's and mobility deficits  are also affecting patient's functional outcome.   REHAB POTENTIAL: Fair due to Freidreich's ataxia, progression of sx's, and mobility deficits  CLINICAL DECISION MAKING: Evolving/moderate complexity  EVALUATION COMPLEXITY: Moderate   GOALS:  SHORT TERM GOALS: Target date:  11/22/2022   Pt will be able to actively perform eversion for improved ankle position and strength.  Baseline: Goal status: PROGRESSING  2.  Pt will demo improved positioning of L ankle in W/C based upon visual observation for improved activation of peroneals and proper length tension relationship for ankle mm for improved stability.   Baseline:  Goal status:  GOAL MET  3.  Pt will demo improved ankle DF AROM to neutral for improved ankle mobility.  Baseline:  Goal status: PARTIALLY MET  4.  Pt will report at least a 25% improvement in mobility.  Baseline:  Goal status: GOAL MET Target date: 11/29/2022   LONG TERM GOALS: Target date: 04/10/2023  Pt will report she is able to perform transfers at home with grab bars independently.   Baseline:  Goal status:  ONGOING  2.  Pt will be able to perform transfers in the clinic with min assist.   Baseline:  Goal status:  ONGOING  3.  Pt will demo improved L ankle AROM to 5-10 deg in DF and 5 deg in Eve for improved ankle  ROM and positioning with  mobility.  Baseline:  Goal status: PROGRESSING  4.  Pt will be able to tolerate 4-5 mins of standing with min assist with UE support for improved tolerance with mobility and activities around her home.  Baseline:  Goal status: ONGOING  5.  Pt will be able to tolerate minimal to moderate manual resistance in L ankle DF and Eve for improved tolerance with Wb'ing and performance of transfers and ambulation.  Baseline:  Goal status: ONGOING    PLAN:  PT FREQUENCY: 1-2x/week  PT DURATION: 8 weeks  PLANNED INTERVENTIONS: Therapeutic exercises, Therapeutic activity, Neuromuscular re-education, Balance training, Gait training, Patient/Family education, Self Care, Joint mobilization, DME instructions, Aquatic Therapy, Dry Needling, Electrical stimulation, Cryotherapy, Moist heat, Taping, Manual therapy, and Re-evaluation  PLAN FOR NEXT SESSION:  Pt is a FALL RISK.  Pt requires max assist +2 to perform transfer. Use a gait belt.  Ankle DF AROM and eve PROM.  L quad and hip strength; use lift at pool.  Cont with aquatic and land therapy with increased focus on aquatic therapy.    Selinda Michaels III PT, DPT 02/14/23 7:45 PM

## 2023-02-14 ENCOUNTER — Encounter (HOSPITAL_BASED_OUTPATIENT_CLINIC_OR_DEPARTMENT_OTHER): Payer: Self-pay | Admitting: Physical Therapy

## 2023-02-20 ENCOUNTER — Ambulatory Visit (HOSPITAL_BASED_OUTPATIENT_CLINIC_OR_DEPARTMENT_OTHER): Payer: Medicare HMO | Admitting: Physical Therapy

## 2023-02-20 ENCOUNTER — Encounter (HOSPITAL_BASED_OUTPATIENT_CLINIC_OR_DEPARTMENT_OTHER): Payer: Self-pay | Admitting: Physical Therapy

## 2023-02-20 DIAGNOSIS — M25672 Stiffness of left ankle, not elsewhere classified: Secondary | ICD-10-CM | POA: Diagnosis not present

## 2023-02-20 DIAGNOSIS — M25572 Pain in left ankle and joints of left foot: Secondary | ICD-10-CM

## 2023-02-20 DIAGNOSIS — M6281 Muscle weakness (generalized): Secondary | ICD-10-CM | POA: Diagnosis not present

## 2023-02-20 DIAGNOSIS — R2689 Other abnormalities of gait and mobility: Secondary | ICD-10-CM

## 2023-02-20 NOTE — Therapy (Signed)
OUTPATIENT PHYSICAL THERAPY TREATMENT NOTE     Patient Name: Robin Glenn MRN: [id number] DOB:Nov 03, 1991, 32 y.o., female Today's Date: 02/20/2023  END OF SESSION:  PT End of Session - 02/20/23 1444     Visit Number 21    Number of Visits 36    Date for PT Re-Evaluation 04/10/23    Authorization Type [employer] MCR    PT Start Time 1401    PT Stop Time 1441    PT Time Calculation (min) 40 min    Activity Tolerance Patient tolerated treatment well    Behavior During Therapy WFL for tasks assessed/performed               Past Medical History:  Diagnosis Date   [name]    History reviewed. No pertinent surgical history. 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      Pt states that standing is improved with transfers and able to put more weight throughout the L LE.    PERTINENT HISTORY: [name]--pt is in a W/C.  She requires maximum assist with transfers.  Pt required assistance with sitting.   Pt is a FALL RISK.  Use a gait belt.    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 No 0/10 Current, 0/10 best, 3/10 worst L ankle  PRECAUTIONS: Fall and Other: Requires extensive assistance with  transfers  WEIGHT BEARING RESTRICTIONS: No  FALLS:  Has patient fallen in last 6 months? Yes. Number of falls 1 with transfer  LIVING ENVIRONMENT: Lives with: lives with their family Lives in: House/apartment Stairs: 2 story home Has following equipment at home: stair lift, grab bars, Rollator  OCCUPATION: Part time job as a Runner, broadcasting/film/video.    PLOF: Needs assistance with ADLs  Family assist with ADLs.  Pt in W/C.  Pt was able to walk a few mins with her personal trainer holding her up her prior to June.  Pt able to perform transfers independently at home with grab bars and required assistance out in the community.  Pt was able to take a few steps with rollator at home.    PATIENT GOALS: to improve strength of ankle, increase Wb'ing on L ankle, perform mobility without brace, walk a couple of mins on TM. 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  L ankle MRI on 10/02/2022: IMPRESSION: 1. Mild tenosynovitis of the peroneal tendons. 2. Ligaments of the medial and lateral ankle ligaments are intact. 3. Small ankle joint effusion. 4. No evidence of fracture or osteonecrosis. 5. Mild edema of the Kager's fat pad. No fluid collection or hematoma.     TODAY'S TREATMENT:  FOTO:  Prior / Current:  63 /  57 with a goal of 62.  POSTURE:  Boyfriend available during session for dependent transfers to table   PROM and manual stretching of L ankle Ankle isometrics 3 way 10x 3s  LAQ 20x Seated HR/TR 20x "Barstool height" seated marching 2x10 "Barstool" height, knee extension/standing with boyfriend controlling trunk and PT setting ankle 2x10 "Barstool height" TKE with boyfriend supporting trunk 2x10 Ankle circles CW and CCW with minA 2x10 each way     PATIENT EDUCATION:  Education details: anatomy, exercise progression,  muscle firing,  HEP and HEP safety,  POC Person educated:  Pt and boyfriend Education method: Explanation, Demonstration, Tactile cues, and Verbal cues Education comprehension: verbalized understanding, returned demonstration, verbal cues required, tactile cues required, and needs further education  HOME EXERCISE PROGRAM: Access Code: [email or web address] URL: [email or web address] Date: 11/14/2022 Prepared by: Zebedee IbaAlan Nation Cradle  Exercises - Supine Ankle Circles  - 1 x daily - 7 x weekly - 2 sets - 10 reps - Supine Ankle Dorsiflexion and Plantarflexion AROM  - 1 x daily - 7 x weekly - 2 sets - 10 reps - Seated Hip Abduction with Resistance  - 1 x daily - 7 x weekly - 3 sets - 10 reps - Standing Weight Shift  - 1 x daily - 7 x weekly - 1 sets - 1 reps - 5 min total hold  ASSESSMENT:  CLINICAL IMPRESSION: Pt with difficulty during session today with AROM of the ankle without first manual stretching and muscle activation type exercise. Pt appears to have more endurance deficits at today's session as pt quickly fatigued at the proximal hip and starting standing and L LE loaded movements. Pt advised that inconsistency with movement likely occurring due to baseline ataxia. Plan to start next land session with manual stretching first, supine exercise, and then to semi-standing/supported L LE WB. Pt has made minimal progress toward goals since prior PN.  Pt may benefit from cont skilled PT services to address ongoing goals and improve ROM, strength, function, and mobility.     OBJECTIVE IMPAIRMENTS: Abnormal gait, decreased activity tolerance, decreased balance, decreased coordination, decreased endurance, decreased mobility, difficulty walking, decreased ROM, decreased strength, hypomobility, impaired flexibility, postural dysfunction, and pain.   ACTIVITY LIMITATIONS: standing, stairs, transfers, bed mobility, dressing, and locomotion level  PARTICIPATION LIMITATIONS: community activity and workout activities  PERSONAL FACTORS: 1  comorbidity: [name] and Progression of sx's and mobility deficits  are also affecting patient's functional outcome.   REHAB POTENTIAL: Fair due to Freidreich's ataxia, progression of sx's, and mobility deficits  CLINICAL DECISION MAKING: Evolving/moderate complexity  EVALUATION COMPLEXITY: Moderate   GOALS:  SHORT TERM GOALS: Target date:  11/22/2022   Pt will be able to actively perform eversion for improved ankle position and strength.  Baseline: Goal status: PROGRESSING  2.  Pt will demo improved positioning of L ankle in W/C based upon visual observation for improved activation of peroneals and proper length tension relationship for ankle mm for improved stability.   Baseline:  Goal status:  GOAL MET  3.  Pt will demo improved ankle DF AROM to neutral for improved ankle mobility.  Baseline:  Goal status: PARTIALLY MET  4.  Pt will report at least a 25% improvement in mobility.  Baseline:  Goal status: GOAL MET Target date: 11/29/2022   LONG TERM GOALS: Target date: 04/10/2023  Pt will report she is able to perform transfers at home with grab bars independently.   Baseline:  Goal status:  ONGOING  2.  Pt will  be able to perform transfers in the clinic with min assist.   Baseline:  Goal status:  ONGOING  3.  Pt will demo improved L ankle AROM to 5-10 deg in DF and 5 deg in Eve for improved ankle ROM and positioning with mobility.  Baseline:  Goal status: PROGRESSING  4.  Pt will be able to tolerate 4-5 mins of standing with min assist with UE support for improved tolerance with mobility and activities around her home.  Baseline:  Goal status: ONGOING  5.  Pt will be able to tolerate minimal to moderate manual resistance in L ankle DF and Eve for improved tolerance with Wb'ing and performance of transfers and ambulation.  Baseline:  Goal status: ONGOING    PLAN:  PT FREQUENCY: 1-2x/week  PT DURATION: 8 weeks  PLANNED INTERVENTIONS: Therapeutic  exercises, Therapeutic activity, Neuromuscular re-education, Balance training, Gait training, Patient/Family education, Self Care, Joint mobilization, DME instructions, Aquatic Therapy, Dry Needling, Electrical stimulation, Cryotherapy, Moist heat, Taping, Manual therapy, and Re-evaluation  PLAN FOR NEXT SESSION:  Pt is a FALL RISK.  Pt requires max assist +2 to perform transfer. Use a gait belt.  Ankle DF AROM and eve PROM.  L quad and hip strength; use lift at pool.  Cont with aquatic and land therapy with increased focus on aquatic therapy.    Zebedee Iba PT, DPT 02/20/23 2:49 PM

## 2023-02-22 ENCOUNTER — Encounter (HOSPITAL_BASED_OUTPATIENT_CLINIC_OR_DEPARTMENT_OTHER): Payer: Self-pay | Admitting: Physical Therapy

## 2023-02-22 ENCOUNTER — Ambulatory Visit (HOSPITAL_BASED_OUTPATIENT_CLINIC_OR_DEPARTMENT_OTHER): Payer: Medicare HMO | Admitting: Physical Therapy

## 2023-02-22 DIAGNOSIS — M6281 Muscle weakness (generalized): Secondary | ICD-10-CM

## 2023-02-22 DIAGNOSIS — R2689 Other abnormalities of gait and mobility: Secondary | ICD-10-CM | POA: Diagnosis not present

## 2023-02-22 DIAGNOSIS — M25572 Pain in left ankle and joints of left foot: Secondary | ICD-10-CM

## 2023-02-22 DIAGNOSIS — M25672 Stiffness of left ankle, not elsewhere classified: Secondary | ICD-10-CM | POA: Diagnosis not present

## 2023-02-22 NOTE — Therapy (Signed)
OUTPATIENT PHYSICAL THERAPY TREATMENT NOTE     Patient Name: Robin Glenn MRN: [id number] DOB:08/07/1991, 32 y.o., female Today's Date: 02/22/2023  END OF SESSION:  PT End of Session - 02/22/23 1918     Visit Number 22    Number of Visits 36    Date for PT Re-Evaluation 04/10/23    Authorization Type AET[employer] MCR    PT Start Time 1405    PT Stop Time 1445    PT Time Calculation (min) 40 min    Activity Tolerance Patient tolerated treatment well    Behavior During Therapy WFL for tasks assessed/performed               Past Medical History:  Diagnosis Date   [name]    History reviewed. No pertinent surgical history. 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      Pt without questions or concerns   PERTINENT HISTORY: [name]--pt is in a W/C.  She requires maximum assist with transfers.  Pt required assistance with sitting.   Pt is a FALL RISK.  Use a gait belt.    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 No 0/10 Current, 0/10 best, 3/10 worst L ankle  PRECAUTIONS: Fall and Other: Requires extensive assistance with transfers  WEIGHT BEARING RESTRICTIONS: No  FALLS:  Has patient fallen in  last 6 months? Yes. Number of falls 1 with transfer  LIVING ENVIRONMENT: Lives with: lives with their family Lives in: House/apartment Stairs: 2 story home Has following equipment at home: stair lift, grab bars, Rollator  OCCUPATION: Part time job as a Runner, broadcasting/film/video.    PLOF: Needs assistance with ADLs  Family assist with ADLs.  Pt in W/C.  Pt was able to walk a few mins with her personal trainer holding her up her prior to June.  Pt able to perform transfers independently at home with grab bars and required assistance out in the community.  Pt was able to take a few steps with rollator at home.    PATIENT GOALS: to improve strength of ankle, increase Wb'ing on L ankle, perform mobility without brace, walk a couple of mins on TM. 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  L ankle MRI on 10/02/2022: IMPRESSION: 1. Mild tenosynovitis of the peroneal tendons. 2. Ligaments of the medial and lateral ankle ligaments are intact. 3. Small ankle joint effusion. 4. No evidence of fracture or osteonecrosis. 5. Mild edema of the Kager's fat pad. No fluid collection or hematoma.     TODAY'S TREATMENT: 02/22/23  Aquatic: Pt seen for aquatic therapy today.  Treatment took place in water 3.25-4.5 ft in depth at the [facility]  pool. Temp of water was 91.  Pt entered/exited the pool via lift. Boy friend assisting.  Max assist to and from wc and lift (boyfriend).     *supported by noodle and therapist/boyfriend 7.5 Lb weigth lle; 5 lb weight rle AND water shoes Standing in  4.3 ft ue support by weighted walker; then 3.8. Able to wb through lle x~5 minutes in each depth continuously with mod assist +2    -with assistance weight bearing through bilat LE. Weight shifting laterally. *Suspended supine with yellow noodle and supported by therapist LE strengthening:  stretching in D1 PNF pattern *Standing supported by noodle wrapped anteriorly across chest and ue support on weighted walk 4.3 ft: forward gait. Mod asst with forward progression of fww min assist LE advancement     Pt requires the buoyancy and hydrostatic pressure of water for support, and to offload joints by unweighting joint load by at least 50 % in navel deep water and by at least 75-80% in chest to neck deep water.  Viscosity of the water is needed for resistance of strengthening. Water current perturbations provides challenge to standing balance requiring increased core activation.       FOTO:  Prior / Current:  35 / 73 with a goal of 62.  POSTURE:  Boyfriend available during session for dependent transfers to table   PROM and manual stretching of L ankle Ankle isometrics 3 way 10x 3s  LAQ 20x Seated HR/TR 20x "Barstool height" seated marching 2x10 "Barstool" height, knee extension/standing with boyfriend controlling trunk and PT setting ankle 2x10 "Barstool height" TKE with boyfriend supporting trunk 2x10 Ankle circles CW and CCW with minA 2x10 each way     PATIENT EDUCATION:  Education details: anatomy, exercise progression,  muscle firing,  HEP and HEP safety, POC Person educated:  Pt and boyfriend Education method: Explanation, Demonstration, Tactile cues, and Verbal cues Education comprehension: verbalized understanding, returned demonstration, verbal cues required, tactile cues required, and needs further education  HOME EXERCISE PROGRAM: Access Code: [email or web address] URL: [email or web address] Date: 11/14/2022 Prepared by: Zebedee Iba  Exercises - Supine Ankle Circles  - 1 x daily - 7 x weekly - 2 sets - 10 reps - Supine Ankle Dorsiflexion and Plantarflexion AROM  - 1 x daily - 7 x weekly - 2 sets - 10 reps - Seated Hip Abduction with Resistance  - 1 x daily - 7 x weekly - 3 sets - 10 reps - Standing Weight Shift  - 1 x daily - 7 x weekly - 1 sets - 1  reps - 5 min total hold  ASSESSMENT:  CLINICAL IMPRESSION: Using weighted fww submerged pt able to use reciprocal le walking pattern with min assist through 4.3 ft submersion depth although due to ataxia she is unable gain foot flat R/L and circumduct's at hips to advance LE. Increased left hip IR tightness (tone?) increases difficulty of gaining left foot placement/foot flat. L hip in extreme internal rotation with ankle inverted and supinated through swing. She as always, putting forth good effort.  She is slow to fatigue today. Pt may benefit from cont skilled PT services to address ongoing goals and improve ROM, strength, function, and mobility.      OBJECTIVE IMPAIRMENTS: Abnormal gait, decreased activity tolerance, decreased balance, decreased coordination, decreased endurance, decreased mobility, difficulty walking, decreased ROM, decreased strength, hypomobility, impaired flexibility, postural dysfunction, and pain.   ACTIVITY LIMITATIONS: standing, stairs, transfers, bed mobility, dressing, and locomotion level  PARTICIPATION LIMITATIONS: community activity and workout activities  PERSONAL FACTORS: 1 comorbidity: [name] and Progression of sx's and mobility deficits  are also affecting patient's functional outcome.   REHAB POTENTIAL: Fair due to Freidreich's ataxia, progression of sx's, and mobility deficits  CLINICAL DECISION MAKING: Evolving/moderate complexity  EVALUATION COMPLEXITY: Moderate   GOALS:  SHORT TERM GOALS: Target date:  11/22/2022   Pt will be able to actively perform eversion for improved ankle position and strength.  Baseline: Goal status: PROGRESSING  2.  Pt will demo improved positioning of L ankle in W/C based upon visual observation for improved activation of peroneals and proper length tension relationship for ankle mm for improved stability.   Baseline:  Goal status:  GOAL MET  3.  Pt will demo improved ankle DF AROM to neutral for  improved ankle mobility.  Baseline:  Goal status: PARTIALLY MET  4.  Pt will report at least a 25% improvement in mobility.  Baseline:  Goal status: GOAL MET Target date: 11/29/2022   LONG TERM GOALS: Target date: 04/10/2023  Pt will report she is able to perform transfers at home with grab bars independently.   Baseline:  Goal status:  ONGOING  2.  Pt will be able to perform transfers in the clinic with min assist.   Baseline:  Goal status:  ONGOING  3.  Pt will demo improved L ankle AROM to 5-10 deg in DF and 5 deg in Eve for improved ankle ROM and positioning with mobility.  Baseline:  Goal status: PROGRESSING  4.  Pt will be able to tolerate 4-5 mins of standing with min assist with UE support for improved tolerance with mobility and activities around her home.  Baseline:  Goal status: ONGOING  5.  Pt will be able to tolerate minimal to moderate manual resistance in L ankle DF and Eve for improved tolerance with Wb'ing and performance of transfers and ambulation.  Baseline:  Goal status: ONGOING    PLAN:  PT FREQUENCY: 1-2x/week  PT DURATION: 8 weeks  PLANNED INTERVENTIONS: Therapeutic exercises, Therapeutic activity, Neuromuscular re-education, Balance training, Gait training, Patient/Family education, Self Care, Joint mobilization, DME instructions, Aquatic Therapy, Dry Needling, Electrical stimulation, Cryotherapy, Moist heat, Taping, Manual therapy, and Re-evaluation  PLAN FOR NEXT SESSION:  Pt is a FALL RISK.  Pt requires max assist +2 to perform transfer. Use a gait belt.  Ankle DF AROM and eve PROM.  L quad and hip strength; use lift at pool.  Cont with aquatic and land therapy with increased focus on aquatic therapy.    Corrie DandyMary Tomma Lightning(Frankie) Robin Glenn MPT 02/22/23 7:19 PM

## 2023-02-27 ENCOUNTER — Encounter (HOSPITAL_BASED_OUTPATIENT_CLINIC_OR_DEPARTMENT_OTHER): Payer: Medicare HMO | Admitting: Physical Therapy

## 2023-03-01 ENCOUNTER — Ambulatory Visit (HOSPITAL_BASED_OUTPATIENT_CLINIC_OR_DEPARTMENT_OTHER): Payer: Medicare HMO | Admitting: Physical Therapy

## 2023-03-01 ENCOUNTER — Encounter (HOSPITAL_BASED_OUTPATIENT_CLINIC_OR_DEPARTMENT_OTHER): Payer: Self-pay | Admitting: Physical Therapy

## 2023-03-01 DIAGNOSIS — M25672 Stiffness of left ankle, not elsewhere classified: Secondary | ICD-10-CM | POA: Diagnosis not present

## 2023-03-01 DIAGNOSIS — M6281 Muscle weakness (generalized): Secondary | ICD-10-CM

## 2023-03-01 DIAGNOSIS — M25572 Pain in left ankle and joints of left foot: Secondary | ICD-10-CM | POA: Diagnosis not present

## 2023-03-01 DIAGNOSIS — R2689 Other abnormalities of gait and mobility: Secondary | ICD-10-CM | POA: Diagnosis not present

## 2023-03-01 NOTE — Therapy (Signed)
OUTPATIENT PHYSICAL THERAPY TREATMENT NOTE     Patient Name: Robin Glenn MRN: [id number] DOB:May 13, 1991, 32 y.o., female Today's Date: 03/01/2023  END OF SESSION:  PT End of Session - 03/01/23 1756     Visit Number 23    Number of Visits 36    Date for PT Re-Evaluation 04/10/23    Authorization Type AETNA MCR    PT Start Time 1403    PT Stop Time 1445    PT Time Calculation (min) 42 min    Activity Tolerance Patient tolerated treatment well    Behavior During Therapy WFL for tasks assessed/performed               Past Medical History:  Diagnosis Date   [name]    History reviewed. No pertinent surgical history. 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      "Went to [facility] and used their pool.  Water was really cold.  I didn't tolerate it for very long. May need to find another warm water pool"   PERTINENT HISTORY: [name]--pt is in a W/C.  She requires maximum assist with transfers.  Pt required assistance with sitting.   Pt is a FALL RISK.  Use a gait belt.    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 No 0/10 Current, 0/10 best, 3/10 worst L ankle  PRECAUTIONS: Fall and Other: Requires  extensive assistance with transfers  WEIGHT BEARING RESTRICTIONS: No  FALLS:  Has patient fallen in last 6 months? Yes. Number of falls 1 with transfer  LIVING ENVIRONMENT: Lives with: lives with their family Lives in: House/apartment Stairs: 2 story home Has following equipment at home: stair lift, grab bars, Rollator  OCCUPATION: Part time job as a Runner, broadcasting/film/video.    PLOF: Needs assistance with ADLs  Family assist with ADLs.  Pt in W/C.  Pt was able to walk a few mins with her personal trainer holding her up her prior to June.  Pt able to perform transfers independently at home with grab bars and required assistance out in the community.  Pt was able to take a few steps with rollator at home.    PATIENT GOALS: to improve strength of ankle, increase Wb'ing on L ankle, perform mobility without brace, walk a couple of mins on TM. 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  L ankle MRI on 10/02/2022: IMPRESSION: 1. Mild tenosynovitis of the peroneal tendons. 2. Ligaments of the medial and lateral ankle ligaments are intact. 3. Small ankle joint effusion. 4. No evidence of fracture or osteonecrosis. 5. Mild edema of the Kager's fat pad. No fluid collection or hematoma.     TODAY'S TREATMENT:  03/01/23  Aquatic: Pt seen for aquatic therapy today.  Treatment took place in water 3.25-4.5 ft in depth at the [facility] pool. Temp of water was 91.  Pt entered/exited the pool via lift. Boy friend assisting.  Max assist to and from wc and lift (boyfriend).     *supported by noodle and therapist/boyfriend 7.5 Lb weigth lle; 5 lb weight rle AND water shoes *STS from lift chair ue support on weighted walker. +2 assist *Standing in  4.3 ft ue support by weighted walker; then 3.8. Difficulty gaining static standing position today.    -amb with reciprocal LE  pattern. Not gaining foot flat with each step, concentration on reciprocal movement. *Suspended supine, vertical and prone with yellow noodle and supported by therapist/cg: add/abd 2x10-15; knees to chest 2x10; hip flex; hip extension *Standing supported by noodle wrapped anteriorly across chest and ue support on weighted walk 4.3 ft: forward gait. Mod asst with forward progression of fww min assist LE advancement     Pt requires the buoyancy and hydrostatic pressure of water for support, and to offload joints by unweighting joint load by at least 50 % in navel deep water and by at least 75-80% in chest to neck deep water.  Viscosity of the water is needed for resistance of strengthening. Water current perturbations provides challenge to standing balance requiring increased core activation.       FOTO:  Prior / Current:  92 / 52 with a goal of 62.  POSTURE:  Boyfriend available during session for dependent transfers to table   PROM and manual stretching of L ankle Ankle isometrics 3 way 10x 3s  LAQ 20x Seated HR/TR 20x "Barstool height" seated marching 2x10 "Barstool" height, knee extension/standing with boyfriend controlling trunk and PT setting ankle 2x10 "Barstool height" TKE with boyfriend supporting trunk 2x10 Ankle circles CW and CCW with minA 2x10 each way     PATIENT EDUCATION:  Education details: anatomy, exercise progression,  muscle firing,  HEP and HEP safety, POC Person educated:  Pt and boyfriend Education method: Explanation, Demonstration, Tactile cues, and Verbal cues Education comprehension: verbalized understanding, returned demonstration, verbal cues required, tactile cues required, and needs further education  HOME EXERCISE PROGRAM: Access Code: [email or web address] URL: [email or web address] Date: 11/14/2022 Prepared by: Zebedee Iba  Exercises - Supine Ankle Circles  - 1 x daily - 7 x weekly - 2 sets - 10 reps - Supine Ankle Dorsiflexion and  Plantarflexion AROM  - 1 x daily - 7 x weekly - 2 sets - 10 reps - Seated Hip Abduction with Resistance  - 1 x daily - 7 x weekly - 3 sets - 10 reps - Standing Weight Shift  - 1 x daily - 7 x weekly - 1 sets - 1 reps - 5 min total hold  ASSESSMENT:  CLINICAL IMPRESSION: Pt and cg attempted completion of aquatic HEP but found the [facility] pool to be too cold.  They are hoping it will warm up as the summer progresses.  They will also look for another pool that may be warmer.  Pt reports her muscles tightened up making it difficult to move.  Todays session focused more on strengthening exercises.  She became frustrated with gaining standing position, getting feet flat.  She reports her ankles have been a little swollen, "not really working right" last few days.  Attempted STS from lift with minimal success. Requires +2-3 (difficult to navigate as feet need securing as well ue core stability needed buoyancy working against  [place]). Minimal glut firing with hip extension palpated. Continued improvement with reciprocal le movement. Pt with improved left hip control with amb. Goals ongoing.        OBJECTIVE IMPAIRMENTS: Abnormal gait, decreased activity tolerance, decreased balance, decreased coordination, decreased endurance, decreased mobility, difficulty walking, decreased ROM, decreased strength, hypomobility, impaired flexibility, postural dysfunction, and pain.   ACTIVITY LIMITATIONS: standing, stairs, transfers, bed mobility, dressing, and locomotion level  PARTICIPATION LIMITATIONS: community activity and workout activities  PERSONAL FACTORS: 1 comorbidity: [name] and Progression of sx's and mobility deficits  are also affecting patient's functional outcome.   REHAB POTENTIAL: Fair due to Freidreich's ataxia, progression of sx's, and mobility deficits  CLINICAL DECISION MAKING: Evolving/moderate complexity  EVALUATION COMPLEXITY: Moderate   GOALS:  SHORT TERM GOALS: Target date:   11/22/2022   Pt will be able to actively perform eversion for improved ankle position and strength.  Baseline: Goal status: PROGRESSING  2.  Pt will demo improved positioning of L ankle in W/C based upon visual observation for improved activation of peroneals and proper length tension relationship for ankle mm for improved stability.   Baseline:  Goal status:  GOAL MET  3.  Pt will demo improved ankle DF AROM to neutral for improved ankle mobility.  Baseline:  Goal status: PARTIALLY MET  4.  Pt will report at least a 25% improvement in mobility.  Baseline:  Goal status: GOAL MET Target date: 11/29/2022   LONG TERM GOALS: Target date: 04/10/2023  Pt will report she is able to perform transfers at home with grab bars independently.   Baseline:  Goal status:  ONGOING  2.  Pt will be able to perform transfers in the clinic with min assist.   Baseline:  Goal status:  ONGOING  3.  Pt will demo improved L ankle AROM to 5-10 deg in DF and 5 deg in Eve for improved ankle ROM and positioning with mobility.  Baseline:  Goal status: PROGRESSING  4.  Pt will be able to tolerate 4-5 mins of standing with min assist with UE support for improved tolerance with mobility and activities around her home.  Baseline:  Goal status: ONGOING  5.  Pt will be able to tolerate minimal to moderate manual resistance in L ankle DF and Eve for improved tolerance with Wb'ing and performance of transfers and ambulation.  Baseline:  Goal status: ONGOING    PLAN:  PT FREQUENCY: 1-2x/week  PT DURATION: 8 weeks  PLANNED INTERVENTIONS: Therapeutic exercises, Therapeutic activity, Neuromuscular re-education, Balance training, Gait training, Patient/Family education, Self Care, Joint mobilization, DME instructions, Aquatic Therapy, Dry Needling, Electrical stimulation, Cryotherapy, Moist heat, Taping, Manual therapy, and Re-evaluation  PLAN FOR NEXT SESSION:  Pt is a FALL RISK.  Pt requires max assist +2 to  perform transfer. Use a gait belt.  Ankle DF AROM and eve PROM.  L quad and hip strength; use lift at pool.  Cont with aquatic and land therapy with increased focus on aquatic therapy.    Corrie Dandy Tomma Lightning) Joniel Graumann MPT 03/01/23 5:57 PM

## 2023-03-08 ENCOUNTER — Ambulatory Visit (HOSPITAL_BASED_OUTPATIENT_CLINIC_OR_DEPARTMENT_OTHER): Payer: Medicare HMO | Admitting: Physical Therapy

## 2023-03-13 ENCOUNTER — Ambulatory Visit (HOSPITAL_BASED_OUTPATIENT_CLINIC_OR_DEPARTMENT_OTHER): Payer: Medicare HMO | Admitting: Physical Therapy

## 2023-03-13 ENCOUNTER — Encounter (HOSPITAL_BASED_OUTPATIENT_CLINIC_OR_DEPARTMENT_OTHER): Payer: Self-pay | Admitting: Physical Therapy

## 2023-03-13 DIAGNOSIS — M6281 Muscle weakness (generalized): Secondary | ICD-10-CM | POA: Diagnosis not present

## 2023-03-13 DIAGNOSIS — M25572 Pain in left ankle and joints of left foot: Secondary | ICD-10-CM

## 2023-03-13 DIAGNOSIS — M25672 Stiffness of left ankle, not elsewhere classified: Secondary | ICD-10-CM

## 2023-03-13 DIAGNOSIS — R2689 Other abnormalities of gait and mobility: Secondary | ICD-10-CM | POA: Diagnosis not present

## 2023-03-13 NOTE — Therapy (Signed)
OUTPATIENT PHYSICAL THERAPY TREATMENT NOTE     Patient Name: Robin Glenn MRN: [id number] DOB:1991-09-17, 32 y.o., female Today's Date: 03/13/2023  END OF SESSION:  PT End of Session - 03/13/23 1519     Visit Number 24    Number of Visits 36    Date for PT Re-Evaluation 04/10/23    Authorization Type [employer]ET[employer] MCR    PT Start Time 1315    PT Stop Time 1356    PT Time Calculation (min) 41 min    Activity Tolerance Patient tolerated treatment well    Behavior During Therapy WFL for tasks assessed/performed                Past Medical History:  Diagnosis Date   [name] (HCC)    History reviewed. No pertinent surgical history. 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     Pt states the ankle did have more swelling for a little bit after last session. They have gotten better.    PERTINENT HISTORY: [name]--pt is in a W/C.  She requires maximum assist with transfers.  Pt required assistance with sitting.   Pt is a FALL RISK.  Use a gait belt.    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 No 0/10 Current, 0/10 best, 3/10 worst L ankle  PRECAUTIONS: Fall and Other: Requires extensive  assistance with transfers  WEIGHT BEARING RESTRICTIONS: No  FALLS:  Has patient fallen in last 6 months? Yes. Number of falls 1 with transfer  LIVING ENVIRONMENT: Lives with: lives with their family Lives in: House/apartment Stairs: 2 story home Has following equipment at home: stair lift, grab bars, Rollator  OCCUPATION: Part time job as a Runner, broadcasting/film/video.    PLOF: Needs assistance with ADLs  Family assist with ADLs.  Pt in W/C.  Pt was able to walk a few mins with her personal trainer holding her up her prior to June.  Pt able to perform transfers independently at home with grab bars and required assistance out in the community.  Pt was able to take a few steps with rollator at home.    PATIENT GOALS: to improve strength of ankle, increase Wb'ing on L ankle, perform mobility without brace, walk a couple of mins on TM. 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  L ankle MRI on 10/02/2022: IMPRESSION: 1. Mild tenosynovitis of the peroneal tendons. 2. Ligaments of the medial and lateral ankle ligaments are intact. 3. Small ankle joint effusion. 4. No evidence of fracture or osteonecrosis. 5. Mild edema of the Kager's fat pad. No fluid collection or hematoma.     TODAY'S TREATMENT: 4/30   Boyfriend  available during session for dependent transfers to table    PROM and manual stretching of L ankle Ankle AROM into DF and EV 3x10 Supine swiss ball L leg press on wall 3x10 (clinician holding knee and hip in alignment) Supine DL bridge 09W (clinician holding LE for alignment and blocking) Supine half bridge/half turn with clinician holding L LE for aligment  03/01/23  Aquatic: Pt seen for aquatic therapy today.  Treatment took place in water 3.25-4.5 ft in depth at the [facility] pool. Temp of water was 91.  Pt entered/exited the pool via lift.  Boy friend assisting.  Max assist to and from wc and lift (boyfriend).     *supported by noodle and therapist/boyfriend 7.5 Lb weigth lle; 5 lb weight rle AND water shoes *STS from lift chair ue support on weighted walker. +2 assist *Standing in  4.3 ft ue support by weighted walker; then 3.8. Difficulty gaining static standing position today.    -amb with reciprocal LE pattern. Not gaining foot flat with each step, concentration on reciprocal movement. *Suspended supine, vertical and prone with yellow noodle and supported by therapist/cg: add/abd 2x10-15; knees to chest 2x10; hip flex; hip extension *Standing supported by noodle wrapped anteriorly across chest and ue support on weighted walk 4.3 ft: forward gait. Mod asst with forward progression of fww min assist LE advancement     Pt requires the buoyancy and hydrostatic pressure of water for support, and to offload joints by unweighting joint load by at least 50 % in navel deep water and by at least 75-80% in chest to neck deep water.  Viscosity of the water is needed for resistance of strengthening. Water current perturbations provides challenge to standing balance requiring increased core activation.       FOTO:  Prior / Current:  44 / 15 with a goal of 62.  POSTURE:  Boyfriend available during session for dependent transfers to table   PROM and manual stretching of L ankle Ankle isometrics 3 way 10x 3s  LAQ 20x Seated HR/TR 20x "Barstool height" seated marching 2x10 "Barstool" height, knee extension/standing with boyfriend controlling trunk and PT setting ankle 2x10 "Barstool height" TKE with boyfriend supporting trunk 2x10 Ankle circles CW and CCW with minA 2x10 each way     PATIENT EDUCATION:  Education details:  exercise progression,  muscle firing,  HEP and HEP safety, POC Person educated:  Pt and boyfriend Education method: Explanation, Demonstration, Tactile cues, and Verbal cues Education comprehension: verbalized  understanding, returned demonstration, verbal cues required, tactile cues required, and needs further education  HOME EXERCISE PROGRAM: Access Code: [email or web address] URL: [email or web address] Date: 11/14/2022 Prepared by: Zebedee Iba  Exercises - Supine Ankle Circles  - 1 x daily - 7 x weekly - 2 sets - 10 reps - Supine Ankle Dorsiflexion and Plantarflexion AROM  - 1 x daily - 7 x weekly - 2 sets - 10 reps - Seated Hip Abduction with Resistance  - 1 x daily - 7 x weekly - 3 sets - 10 reps - Standing Weight Shift  - 1 x daily - 7 x weekly - 1 sets - 1 reps - 5 min total hold  ASSESSMENT:  CLINICAL IMPRESSION: Land based session today started with manual stretching and active warm up of movement which did seem to improve L ankle DF and EV position. Pt able to perform leg press type movement and half bridging today with less clinician assist for flat foot and ankle setting. Lengthy discussion with pt  regarding transferring to neuro speciality clinic for use of harness system in order to progress standing WB and gait as pt requires maxAx2 to perform standing position due to lack of coordination and trunk control. Pt agreeable. PT to speak with aquatic therapist about facilitating transfer. Pt would benefit from continued skilled therapy in order to reach goals and maximize functional L LE strength and ROM for prevention of further functional decline and working toward PLOF/independence.   OBJECTIVE IMPAIRMENTS: Abnormal gait, decreased activity tolerance, decreased balance, decreased coordination, decreased endurance, decreased mobility, difficulty walking, decreased ROM, decreased strength, hypomobility, impaired flexibility, postural dysfunction, and pain.   ACTIVITY LIMITATIONS: standing, stairs, transfers, bed mobility, dressing, and locomotion level  PARTICIPATION LIMITATIONS: community activity and workout activities  PERSONAL FACTORS: 1 comorbidity: [name] and  Progression of sx's and mobility deficits  are also affecting patient's functional outcome.   REHAB POTENTIAL: Fair due to Freidreich's ataxia, progression of sx's, and mobility deficits  CLINICAL DECISION MAKING: Evolving/moderate complexity  EVALUATION COMPLEXITY: Moderate   GOALS:  SHORT TERM GOALS: Target date:  11/22/2022   Pt will be able to actively perform eversion for improved ankle position and strength.  Baseline: Goal status: PROGRESSING  2.  Pt will demo improved positioning of L ankle in W/C based upon visual observation for improved activation of peroneals and proper length tension relationship for ankle mm for improved stability.   Baseline:  Goal status:  GOAL MET  3.  Pt will demo improved ankle DF AROM to neutral for improved ankle mobility.  Baseline:  Goal status: PARTIALLY MET  4.  Pt will report at least a 25% improvement in mobility.  Baseline:  Goal status: GOAL MET Target date: 11/29/2022   LONG TERM GOALS: Target date: 04/10/2023  Pt will report she is able to perform transfers at home with grab bars independently.   Baseline:  Goal status:  ONGOING  2.  Pt will be able to perform transfers in the clinic with min assist.   Baseline:  Goal status:  ONGOING  3.  Pt will demo improved L ankle AROM to 5-10 deg in DF and 5 deg in Eve for improved ankle ROM and positioning with mobility.  Baseline:  Goal status: PROGRESSING  4.  Pt will be able to tolerate 4-5 mins of standing with min assist with UE support for improved tolerance with mobility and activities around her home.  Baseline:  Goal status: ONGOING  5.  Pt will be able to tolerate minimal to moderate manual resistance in L ankle DF and Eve for improved tolerance with Wb'ing and performance of transfers and ambulation.  Baseline:  Goal status: ONGOING    PLAN:  PT FREQUENCY: 1-2x/week  PT DURATION: 8 weeks  PLANNED INTERVENTIONS: Therapeutic exercises, Therapeutic activity,  Neuromuscular re-education, Balance training, Gait training, Patient/Family education, Self Care, Joint mobilization, DME instructions, Aquatic Therapy, Dry Needling, Electrical stimulation, Cryotherapy, Moist heat, Taping, Manual therapy, and Re-evaluation  PLAN FOR NEXT SESSION:  Pt is a FALL RISK.  Pt requires max assist +2 to perform transfer. Use a gait belt.  Ankle DF AROM and eve PROM.  L quad and hip strength; use lift at pool.  Cont with aquatic and land therapy with increased focus on aquatic therapy.    Zebedee Iba PT, DPT 03/13/23 3:24 PM

## 2023-03-15 ENCOUNTER — Encounter (HOSPITAL_BASED_OUTPATIENT_CLINIC_OR_DEPARTMENT_OTHER): Payer: Self-pay | Admitting: Physical Therapy

## 2023-03-15 ENCOUNTER — Ambulatory Visit (HOSPITAL_BASED_OUTPATIENT_CLINIC_OR_DEPARTMENT_OTHER): Payer: Medicare HMO | Attending: Neurology | Admitting: Physical Therapy

## 2023-03-15 DIAGNOSIS — M25572 Pain in left ankle and joints of left foot: Secondary | ICD-10-CM

## 2023-03-15 DIAGNOSIS — R2689 Other abnormalities of gait and mobility: Secondary | ICD-10-CM

## 2023-03-15 DIAGNOSIS — M6281 Muscle weakness (generalized): Secondary | ICD-10-CM | POA: Diagnosis not present

## 2023-03-15 DIAGNOSIS — M25672 Stiffness of left ankle, not elsewhere classified: Secondary | ICD-10-CM | POA: Diagnosis not present

## 2023-03-15 NOTE — Therapy (Signed)
OUTPATIENT PHYSICAL THERAPY TREATMENT NOTE     Patient Name: Robin Glenn MRN: [id number] DOB:Oct 31, 1991, 32 y.o., female Today's Date: 03/15/2023  END OF SESSION:  PT End of Session - 03/15/23 1829     Visit Number 25    Number of Visits 36    Date for PT Re-Evaluation 04/10/23    Authorization Type [employer]ET[employer] MCR    PT Start Time 1415    PT Stop Time 1453    PT Time Calculation (min) 38 min    Activity Tolerance Patient tolerated treatment well    Behavior During Therapy WFL for tasks assessed/performed                Past Medical History:  Diagnosis Date   [name] (HCC)    History reviewed. No pertinent surgical history. 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     Pt states she has not been able to find another warm water pool.  Will be waiting for temp outside to warm up and hopefully warm up pools.    PERTINENT HISTORY: [name]--pt is in a W/C.  She requires maximum assist with transfers.  Pt required assistance with sitting.   Pt is a FALL RISK.  Use a gait belt.    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 No 0/10 Current, 0/10 best, 3/10 worst L ankle  PRECAUTIONS:  Fall and Other: Requires extensive assistance with transfers  WEIGHT BEARING RESTRICTIONS: No  FALLS:  Has patient fallen in last 6 months? Yes. Number of falls 1 with transfer  LIVING ENVIRONMENT: Lives with: lives with their family Lives in: House/apartment Stairs: 2 story home Has following equipment at home: stair lift, grab bars, Rollator  OCCUPATION: Part time job as a Runner, broadcasting/film/video.    PLOF: Needs assistance with ADLs  Family assist with ADLs.  Pt in W/C.  Pt was able to walk a few mins with her personal trainer holding her up her prior to June.  Pt able to perform transfers independently at home with grab bars and required assistance out in the community.  Pt was able to take a few steps with rollator at home.    PATIENT GOALS: to improve strength of ankle, increase Wb'ing on L ankle, perform mobility without brace, walk a couple of mins on TM. 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  L ankle MRI on 10/02/2022: IMPRESSION: 1. Mild tenosynovitis of the peroneal tendons. 2. Ligaments of the medial and lateral ankle ligaments are intact. 3. Small ankle joint effusion. 4. No evidence of fracture or osteonecrosis. 5. Mild edema of the Kager's fat pad. No fluid collection or hematoma.  TODAY'S TREATMENT: 03/16/23  Aquatic: Pt seen for aquatic therapy today.  Treatment took place in water 3.25-4.5 ft in depth at the [facility] pool. Temp of water was 91.  Pt entered/exited the pool via lift. Boy friend assisting.  Max assist to and from wc and lift (boyfriend).     *supported by noodle and therapist/boyfriend 7.5 Lb weigth lle; 5 lb weight rle AND water shoes  *Standing in  4.3 ft ue support by weighted walker; then 3.8.     -amb with reciprocal LE pattern. Not gaining foot flat with each step, concentration on reciprocal  movement. *Suspended supine and vertical with yellow noodle and supported by therapist/cg: add/abd 2x10-15; knees to chest 2x10; hip flex; hip extension *Standing supported by noodle wrapped anteriorly across chest and ue support on weighted walk 4.3 ft: forward gait. Mod asst with forward progression of fww min assist LE advancement. Completed without le weights.  Pt continues with reciprocal movement but mucl ess controlled without weighted assist.     Pt requires the buoyancy and hydrostatic pressure of water for support, and to offload joints by unweighting joint load by at least 50 % in navel deep water and by at least 75-80% in chest to neck deep water.  Viscosity of the water is needed for resistance of strengthening. Water current perturbations provides challenge to standing balance requiring increased core activation.    4/30   Boyfriend available during session for dependent transfers to table    PROM and manual stretching of L ankle Ankle AROM into DF and EV 3x10 Supine swiss ball L leg press on wall 3x10 (clinician holding knee and hip in alignment) Supine DL bridge 45W (clinician holding LE for alignment and blocking) Supine half bridge/half turn with clinician holding L LE for aligment  03/01/23  Aquatic: Pt seen for aquatic therapy today.  Treatment took place in water 3.25-4.5 ft in depth at the [facility] pool. Temp of water was 91.  Pt entered/exited the pool via lift. Boy friend assisting.  Max assist to and from wc and lift (boyfriend).     *supported by noodle and therapist/boyfriend 7.5 Lb weigth lle; 5 lb weight rle AND water shoes *STS from lift chair ue support on weighted walker. +2 assist *Standing in  4.3 ft ue support by weighted walker; then 3.8. Difficulty gaining static standing position today.    -amb with reciprocal LE pattern. Not gaining foot flat with each step, concentration on reciprocal movement. *Suspended supine, vertical and prone  with yellow noodle and supported by therapist/cg: add/abd 2x10-15; knees to chest 2x10; hip flex; hip extension *Standing supported by noodle wrapped anteriorly across chest and ue support on weighted walk 4.3 ft: forward gait. Mod asst with forward progression of fww min assist LE advancement     Pt requires the buoyancy and hydrostatic pressure of water for support, and to offload joints by unweighting joint load by at least 50 % in navel deep water and by at least 75-80% in chest to neck deep water.  Viscosity of the water is needed for resistance of strengthening. Water current perturbations provides challenge to standing balance requiring increased core activation.       FOTO:  Prior / Current:  2 / 54 with a goal of 62.  POSTURE:  Boyfriend available during session for dependent transfers to table   PROM and manual stretching of L ankle Ankle isometrics 3 way 10x 3s  LAQ 20x Seated HR/TR 20x "Barstool height" seated  marching 2x10 "Barstool" height, knee extension/standing with boyfriend controlling trunk and PT setting ankle 2x10 "Barstool height" TKE with boyfriend supporting trunk 2x10 Ankle circles CW and CCW with minA 2x10 each way     PATIENT EDUCATION:  Education details:  exercise progression,  muscle firing,  HEP and HEP safety, POC Person educated:  Pt and boyfriend Education method: Explanation, Demonstration, Tactile cues, and Verbal cues Education comprehension: verbalized understanding, returned demonstration, verbal cues required, tactile cues required, and needs further education  HOME EXERCISE PROGRAM: Access Code: [email or web address] URL: [email or web address] Date: 11/14/2022 Prepared by: Zebedee Iba  Exercises - Supine Ankle Circles  - 1 x daily - 7 x weekly - 2 sets - 10 reps - Supine Ankle Dorsiflexion and Plantarflexion AROM  - 1 x daily - 7 x weekly - 2 sets - 10 reps - Seated Hip Abduction with Resistance  - 1 x daily - 7 x weekly - 3 sets -  10 reps - Standing Weight Shift  - 1 x daily - 7 x weekly - 1 sets - 1 reps - 5 min total hold  ASSESSMENT:  CLINICAL IMPRESSION: Pt continues with left leg spasticity into internal hip rotation and ankle inversion with indep placement of left foot on floor. She circumducts at hip with swing.  Reciprocal pattern present but requires max assist +2 using weighted walker for forward progression.  Upright position maintained by thick noodle as lacking the core strength to gain or sustain position indep. Discussion on transferring pt to neuro specialty clinic for further treatment as we may be approaching maximal potential with ortho dx. Potentially pt will need new referral. Functional gait may be difficult going forward. Goals ongoing    OBJECTIVE IMPAIRMENTS: Abnormal gait, decreased activity tolerance, decreased balance, decreased coordination, decreased endurance, decreased mobility, difficulty walking, decreased ROM, decreased strength, hypomobility, impaired flexibility, postural dysfunction, and pain.   ACTIVITY LIMITATIONS: standing, stairs, transfers, bed mobility, dressing, and locomotion level  PARTICIPATION LIMITATIONS: community activity and workout activities  PERSONAL FACTORS: 1 comorbidity: [name] and Progression of sx's and mobility deficits  are also affecting patient's functional outcome.   REHAB POTENTIAL: Fair due to Freidreich's ataxia, progression of sx's, and mobility deficits  CLINICAL DECISION MAKING: Evolving/moderate complexity  EVALUATION COMPLEXITY: Moderate   GOALS:  SHORT TERM GOALS: Target date:  11/22/2022   Pt will be able to actively perform eversion for improved ankle position and strength.  Baseline: Goal status: PROGRESSING  2.  Pt will demo improved positioning of L ankle in W/C based upon visual observation for improved activation of peroneals and proper length tension relationship for ankle mm for improved stability.   Baseline:   Goal status:  GOAL MET  3.  Pt will demo improved ankle DF AROM to neutral for improved ankle mobility.  Baseline:  Goal status: PARTIALLY MET  4.  Pt will report at least a 25% improvement in mobility.  Baseline:  Goal status: GOAL MET Target date: 11/29/2022   LONG TERM GOALS: Target date: 04/10/2023  Pt will report she is able to perform transfers at home with grab bars independently.   Baseline:  Goal status:  ONGOING  2.  Pt will be able to perform transfers in the clinic with min assist.   Baseline:  Goal status:  ONGOING  3.  Pt will demo improved L ankle AROM to 5-10 deg in DF and 5 deg in Eve for improved ankle ROM and positioning with mobility.  Baseline:  Goal status: PROGRESSING  4.  Pt will be able to tolerate 4-5 mins of standing with min assist with UE support for improved tolerance with mobility and activities around her home.  Baseline:  Goal status: ONGOING  5.  Pt will be able to tolerate minimal to moderate manual resistance in L ankle DF and Eve for improved tolerance with Wb'ing and performance of transfers and ambulation.  Baseline:  Goal status: ONGOING    PLAN:  PT FREQUENCY: 1-2x/week  PT DURATION: 8 weeks  PLANNED INTERVENTIONS: Therapeutic exercises, Therapeutic activity, Neuromuscular re-education, Balance training, Gait training, Patient/Family education, Self Care, Joint mobilization, DME instructions, Aquatic Therapy, Dry Needling, Electrical stimulation, Cryotherapy, Moist heat, Taping, Manual therapy, and Re-evaluation  PLAN FOR NEXT SESSION:  Pt is a FALL RISK.  Pt requires max assist +2 to perform transfer. Use a gait belt.  Ankle DF AROM and eve PROM.  L quad and hip strength; use lift at pool.  Cont with aquatic and land therapy with increased focus on aquatic therapy.    Rushie Chestnut) Katheryn Culliton MPT 03/15/23 6:41 PM

## 2023-03-21 ENCOUNTER — Encounter (HOSPITAL_BASED_OUTPATIENT_CLINIC_OR_DEPARTMENT_OTHER): Payer: Medicare HMO | Admitting: Physical Therapy

## 2023-03-22 ENCOUNTER — Ambulatory Visit (HOSPITAL_BASED_OUTPATIENT_CLINIC_OR_DEPARTMENT_OTHER): Payer: Medicare HMO | Admitting: Physical Therapy

## 2023-03-26 ENCOUNTER — Ambulatory Visit: Payer: Medicare HMO | Attending: Podiatry | Admitting: Physical Therapy

## 2023-03-26 ENCOUNTER — Encounter: Payer: Self-pay | Admitting: Physical Therapy

## 2023-03-26 VITALS — BP 134/59 | HR 86

## 2023-03-26 DIAGNOSIS — M25572 Pain in left ankle and joints of left foot: Secondary | ICD-10-CM | POA: Diagnosis not present

## 2023-03-26 DIAGNOSIS — R2689 Other abnormalities of gait and mobility: Secondary | ICD-10-CM | POA: Insufficient documentation

## 2023-03-26 DIAGNOSIS — M6281 Muscle weakness (generalized): Secondary | ICD-10-CM | POA: Diagnosis not present

## 2023-03-26 DIAGNOSIS — M25672 Stiffness of left ankle, not elsewhere classified: Secondary | ICD-10-CM | POA: Diagnosis not present

## 2023-03-26 NOTE — Therapy (Unsigned)
OUTPATIENT PHYSICAL THERAPY TREATMENT NOTE    Patient Name: Robin Glenn MRN: [id number] DOB:[birth year], 32 y.o., female Today's Date: 03/27/2023  END OF SESSION:  PT End of Session - 03/26/23 1400     Visit Number 26    Number of Visits 36    Date for PT Re-Evaluation 04/10/23    Authorization Type [employer]ET[employer] MCR    PT Start Time 1401    PT Stop Time 1445    PT Time Calculation (min) 44 min    Equipment Utilized During Treatment Gait belt    Activity Tolerance Patient tolerated treatment well    Behavior During Therapy WFL for tasks assessed/performed             Past Medical History:  Diagnosis Date   [name] (HCC)    History reviewed. No pertinent surgical history. 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    Patient reports that she is planning on transitioning to neuro rehab full time. She has been previously been seen by ortho rehab and aquatic. Patient states that she feels like she has maxed out in ortho and is hoping to work on other functional goals. Patient reports that her goals are being able to safely use a public restroom with a family member present in addition to trying to take a few assisted steps. Patient denies any new falls.    PERTINENT HISTORY: [name]--pt is in a W/C.  She requires maximum assist with transfers.  Pt required assistance with sitting.   Pt is a FALL RISK.  Use a gait belt.   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 No 0/10 Current, 0/10 best, 3/10 worst L ankle  PRECAUTIONS: Fall and Other: Requires extensive assistance with transfers  WEIGHT BEARING RESTRICTIONS: No  FALLS:  Has patient fallen in last 6 months? Yes. Number of falls 1 with transfer  LIVING ENVIRONMENT: Lives with: lives with their family Lives in: House/apartment Stairs: 2 story home Has following equipment at home: stair lift, grab bars, Rollator  OCCUPATION: Part time job as a Runner, broadcasting/film/video.    PLOF: Needs assistance with ADLs  Family assist with ADLs.  Pt in W/C.  Pt was able to walk a few mins with her personal trainer holding her up her prior to June.  Pt able to perform transfers independently at home with grab bars and required assistance out in the community.  Pt was able to take a few steps with rollator at home.    PATIENT GOALS: to improve strength of ankle, increase Wb'ing on L ankle, perform mobility without brace, walk a couple of mins on TM. "I have [name], so exercise is very important for me"  'being able to safely use a public restroom with a family member present  in addition to trying to take a few assisted steps'   OBJECTIVE:   DIAGNOSTIC FINDINGS:  X rays of L ankle:  No fracture.  L ankle MRI on 10/02/2022: IMPRESSION: 1. Mild tenosynovitis of the peroneal tendons. 2. Ligaments of the medial and lateral ankle ligaments are intact. 3. Small ankle joint effusion. 4. No evidence of fracture or osteonecrosis. 5. Mild edema of the Kager's fat pad. No fluid collection or hematoma.   TODAY'S TREATMENT:  [facility] - Outpatient Neuro (03/26/2023)  Patient arrives to session in manual  wheelchair. Patient reports that she has a power wheelchair at home. Significant posterior pelvic tilt and poor trunk control noted in seated. Caregiver dons ASO (leather tie up brace) to stabilize L ankle for session.  TherAct: Discussion of current approaches patient is using at home Discussed how patient is functionally transferring at home at this time. Patient reports needing maxA from her boyfriend or brother for most transfers unless she has two or more grab bars and then she is able to perform a squat pivot transfer with her mom nearby. Patient has to wear a L ankle brace when pivoting with her mother.  Transfer Education Patient's boyfriends demonstrates how transfers are performed at home in which he scoops down and has patient loop her arms around neck and then carries her from wheelchair to mat ~6 feet with modified stand pivot with poor LLE ankle positioning with total A Therapist educate on use of squat pivot technique and head hip relationship with modA x 2 to improve ease for home; current wheelchair lacking breaks so second therapist assist by stabilizing MWC Patient required minA on mat to stabilize with fatigue but otherwise SBA with UE support to maintain upright Wheelchair Education Provided [employer] contact follow up to repair breaks and to address low tred on tires; current wheelchair has poor break control  NMR: Between // bars with MWC behind; required second therapist to help block out [facility] given brake instability Sit to stand with bilateral UE use - minA to stand and modA to stabilize between // bars - required use of therapist blocking ankle inversion in standing  Standing in // bars with weight shift R and L - patient able to perform with minA Pre-gait stepping forward/back x 6 steps during session - dysmetric due to coordination deficits and required minA from therapist to help with foot repositioning   PATIENT EDUCATION:  Education details:  POC for outpatient neuro,  provided contact for follow up with [employer] Person educated:  Pt and boyfriend Education method: Explanation, Demonstration, Tactile cues, and Verbal cues Education comprehension: verbalized understanding, returned demonstration, verbal cues required, tactile cues required, and needs further education  HOME EXERCISE PROGRAM: Access Code: [email or web address] URL: [email or web address] Date: 11/14/2022 Prepared by: Zebedee Iba  Exercises - Supine Ankle Circles  - 1 x daily - 7 x weekly - 2 sets - 10 reps - Supine Ankle Dorsiflexion and Plantarflexion AROM  - 1 x daily - 7 x weekly - 2 sets - 10 reps - Seated Hip Abduction with Resistance  - 1 x daily - 7 x weekly - 3 sets - 10 reps - Standing Weight Shift  - 1 x daily - 7 x weekly - 1 sets - 1 reps - 5 min total hold  ASSESSMENT:  CLINICAL IMPRESSION: Session emphasized working on functional tasks including trialing standing in // bars and working on transfer techniques with boyfriend. Patient able to tolerate approximately 2 trials of standing in // bars with  UE support for ~2-6min. Patient will benefit from education on improving wheelchair safety and transfer techniques in future sessions. Recommend a second assist during sessions to assist with functional activities. Goals ongoing.   OBJECTIVE IMPAIRMENTS: Abnormal gait, decreased activity tolerance, decreased balance, decreased coordination, decreased endurance, decreased mobility, difficulty walking, decreased ROM, decreased strength, hypomobility, impaired flexibility, postural dysfunction, and pain.   ACTIVITY LIMITATIONS: standing, stairs, transfers, bed mobility, dressing, and locomotion level  PARTICIPATION LIMITATIONS: community activity and workout activities  PERSONAL FACTORS: 1 comorbidity: [name] and Progression of sx's and mobility deficits  are also affecting patient's functional outcome.   REHAB POTENTIAL: Fair due to Freidreich's ataxia, progression of  sx's, and mobility deficits  CLINICAL DECISION MAKING: Evolving/moderate complexity  EVALUATION COMPLEXITY: Moderate   GOALS:  SHORT TERM GOALS: Target date:  11/22/2022   Pt will be able to actively perform eversion for improved ankle position and strength.  Baseline: Goal status: PROGRESSING  2.  Pt will demo improved positioning of L ankle in W/C based upon visual observation for improved activation of peroneals and proper length tension relationship for ankle mm for improved stability.   Baseline:  Goal status:  GOAL MET  3.  Pt will demo improved ankle DF AROM to neutral for improved ankle mobility.  Baseline:  Goal status: PARTIALLY MET  4.  Pt will report at least a 25% improvement in mobility.  Baseline:  Goal status: GOAL MET Target date: 11/29/2022   LONG TERM GOALS: Target date: 04/10/2023  Pt will report she is able to perform transfers at home with grab bars independently.   Baseline:  Goal status:  ONGOING  2.  Pt will be able to perform transfers in the clinic with min assist.   Baseline:  Goal status:  ONGOING  3.  Pt will demo improved L ankle AROM to 5-10 deg in DF and 5 deg in Eve for improved ankle ROM and positioning with mobility.  Baseline:  Goal status: PROGRESSING  4.  Pt will be able to tolerate 4-5 mins of standing with min assist with UE support for improved tolerance with mobility and activities around her home.  Baseline:  Goal status: ONGOING  5.  Pt will be able to tolerate minimal to moderate manual resistance in L ankle DF and Eve for improved tolerance with Wb'ing and performance of transfers and ambulation.  Baseline:  Goal status: ONGOING  PLAN:  PT FREQUENCY: 1-2x/week  PT DURATION: 8 weeks  PLANNED INTERVENTIONS: Therapeutic exercises, Therapeutic activity, Neuromuscular re-education, Balance training, Gait training, Patient/Family education, Self Care, Joint mobilization, DME instructions, Aquatic Therapy, Dry Needling,  Electrical stimulation, Cryotherapy, Moist heat, Taping, Manual therapy, and Re-evaluation  PLAN FOR NEXT SESSION:  Pt is a FALL RISK.  Pt requires max assist +2 to perform transfer. Use a gait belt.  Ankle DF AROM and eve PROM.  L quad and hip strength; use lift at pool.  Cont with aquatic and land therapy with increased focus on aquatic therapy.    Next: Work on transfer techniques, follow up about wheelchair being fixed, standing in // bars with weight shifting and possible steps  Maryruth Eve, PT, DPT 03/27/23 9:19 AM

## 2023-03-29 ENCOUNTER — Encounter (HOSPITAL_BASED_OUTPATIENT_CLINIC_OR_DEPARTMENT_OTHER): Payer: Medicare HMO | Admitting: Physical Therapy

## 2023-04-02 ENCOUNTER — Ambulatory Visit: Payer: Medicare HMO | Admitting: Physical Therapy

## 2023-04-02 DIAGNOSIS — M6281 Muscle weakness (generalized): Secondary | ICD-10-CM | POA: Diagnosis not present

## 2023-04-02 DIAGNOSIS — M25672 Stiffness of left ankle, not elsewhere classified: Secondary | ICD-10-CM | POA: Diagnosis not present

## 2023-04-02 DIAGNOSIS — M25572 Pain in left ankle and joints of left foot: Secondary | ICD-10-CM

## 2023-04-02 DIAGNOSIS — R2689 Other abnormalities of gait and mobility: Secondary | ICD-10-CM | POA: Diagnosis not present

## 2023-04-02 NOTE — Therapy (Signed)
OUTPATIENT PHYSICAL THERAPY TREATMENT NOTE-RECERTIFICATION  Patient Name: Robin Glenn MRN: [id number] DOB:1991-08-15, 32 y.o., female Today's Date: 04/02/2023  END OF SESSION:  PT End of Session - 04/02/23 1317     Visit Number 27    Number of Visits 44   recert   Date for PT Re-Evaluation 06/11/23   to allow for scheduling delays   Authorization Type [employer]ET[employer] MCR    Progress Note Due on Visit 30    PT Start Time 1315    PT Stop Time 1403    PT Time Calculation (min) 48 min    Equipment Utilized During Treatment Gait belt    Activity Tolerance Patient tolerated treatment well    Behavior During Therapy WFL for tasks assessed/performed              Past Medical History:  Diagnosis Date   [name] (HCC)    No past surgical history on file. 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    Pt reports no acute changes since last visit, no falls. No complaints of pain today. Pt's boyfriend did adjust wheelchair brakes slightly, improved from last session.   PERTINENT HISTORY: [name]--pt is in a W/C.  She requires maximum assist with transfers.  Pt required assistance with sitting.   Pt is a FALL RISK.  Use a gait belt.   hypertrophic  cardiomyopathy due to Friedeich's ataxia, scoliosis  PAIN:  Are you having pain? No 0/10 Current, 0/10 best, 3/10 worst L ankle  PRECAUTIONS: Fall and Other: Requires extensive assistance with transfers  WEIGHT BEARING RESTRICTIONS: No  FALLS:  Has patient fallen in last 6 months? Yes. Number of falls 1 with transfer  LIVING ENVIRONMENT: Lives with: lives with their family Lives in: House/apartment Stairs: 2 story home Has following equipment at home: stair lift, grab bars, Rollator  OCCUPATION: Part time job as a Runner, broadcasting/film/video.    PLOF: Needs assistance with ADLs  Family assist with ADLs.  Pt in W/C.  Pt was able to walk a few mins with her personal trainer holding her up her prior to June.  Pt able to perform transfers independently at home with grab bars and required assistance out in the community.  Pt was able to take a few steps with rollator at home.    PATIENT GOALS: to improve strength of ankle, increase Wb'ing on L ankle, perform mobility without brace, walk a couple of mins on TM. "I have [name], so exercise is very important for me"  'being able to safely use a public restroom with a family member present in addition to trying to take a few assisted steps'   OBJECTIVE:   DIAGNOSTIC FINDINGS:  X rays of L ankle:  No fracture.  L ankle MRI on 10/02/2022: IMPRESSION: 1. Mild tenosynovitis of  the peroneal tendons. 2. Ligaments of the medial and lateral ankle ligaments are intact. 3. Small ankle joint effusion. 4. No evidence of fracture or osteonecrosis. 5. Mild edema of the Kager's fat pad. No fluid collection or hematoma.   TODAY'S TREATMENT:  TherAct: Sit to stand in // bars with min A with L ankle blocked by therapist's LE. Pt wearing custom L ASO during standing this session as well. Pt does continue to exhibit L ankle inversion in stance with increase in spasticity noted during standing. Pt able to ambulate x 5 ft in //  bars with close w/c follow, min A needed for limb advancement and for standing balance. Pt exhibits B knee hyperextension in standing and difficulty with weight shift onto her LLE due to discomfort with ankle in inversion spasticity. Trialed Swedish knee cage on L knee this session, pt does benefit from use of Swedish knee cage to prevent L knee hyperextension even though brace is too large for patient. Pt may benefit from referral to orthotist in the future for assessment of B Swedish knee cages due to B knee hyperextension in stance and some pain in her R knee > L knee.  Assisted pt with adjusting wheelchair brakes for improved safety and stability of her device. Pt does still need to reach out to [employer] regarding getting new tires for her manual wheelchair.  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 Encouraged pt to reach out to her neurologist (Dr. Terrace Arabia) regarding medical management of her spasticity.  Discussed pt's current training regimen with her personal trainer (Tues, Thu, Sat every week): leg machines such as hip abd/hip add, HS curls, knee extension; stands up to pull-up bar to perform mini-squats x 5 reps (wears custom ankle brace); works on [employer] strengthening with weight machines; perform mat exercises such as leg lifts, hip abd/add with AAROM; home workout: leg and ankle stretches on yoga mat   TherEx: Reviewed seated ankle DF and eversion stretches to perform at home; unable to find pictures in MedBridge so provided video demonstration for patient and her boyfriend.    PATIENT EDUCATION:  Education details:  POC for outpatient neuro, encouraged pt to schedule with Dr. Terrace Arabia to discuss medical management of spasticity, continue HEP and training regimen, added ankle stretches to HEP Person educated:  Pt and boyfriend Education method: Explanation, Demonstration, Tactile cues, and Verbal cues Education comprehension: verbalized understanding, returned  demonstration, verbal cues required, tactile cues required, and needs further education  HOME EXERCISE PROGRAM: Access Code: [email or web address] URL: [email or web address] Date: 11/14/2022 Prepared by: Zebedee Iba  Exercises - Supine Ankle Circles  - 1 x daily - 7 x weekly - 2 sets - 10 reps - Supine Ankle Dorsiflexion and Plantarflexion AROM  - 1 x daily - 7 x weekly - 2 sets - 10 reps - Seated Hip Abduction with Resistance  - 1 x daily - 7 x weekly - 3 sets - 10 reps - Standing Weight Shift  - 1 x daily - 7 x weekly - 1 sets - 1 reps - 5 min total hold   Verbally added 04/02/23: -Seated L Ankle Dorsiflexion Stretch 5 x 30 sec each -Seated L Ankle Eversion Stretch 5 x 30 sec each  ASSESSMENT:  CLINICAL IMPRESSION: Emphasis of skilled PT session on working on standing in // bars to continue to assess pt's ability to perform this safely and to assess her spasticity in L ankle with standing as well as discussing PT POC,  pt goals, and medical management. Pt continues to only require up to min A to stand in // bars, does continue to exhibit L ankle inversion spasticity even with custom ASO as well as B knee hyperextension in standing. Pt can benefit from further assessment of need for B Swedish knee cages in standing to block ongoing hyperextension. Pt can also benefit from medical management of her spasticity to allow safer and more functional standing and transfers. Pt continues to benefit from skilled therapy services to address her ongoing spasticity, limb and trunk ataxia, impaired balance, and decreased safety and independence with her functional mobility. Continue POC.   OBJECTIVE IMPAIRMENTS: Abnormal gait, decreased activity tolerance, decreased balance, decreased coordination, decreased endurance, decreased mobility, difficulty walking, decreased ROM, decreased strength, hypomobility, impaired flexibility, postural dysfunction, and pain.   ACTIVITY LIMITATIONS: standing, stairs,  transfers, bed mobility, dressing, and locomotion level  PARTICIPATION LIMITATIONS: community activity and workout activities  PERSONAL FACTORS: 1 comorbidity: [name] and Progression of sx's and mobility deficits  are also affecting patient's functional outcome.   REHAB POTENTIAL: Fair due to Freidreich's ataxia, progression of sx's, and mobility deficits  CLINICAL DECISION MAKING: Evolving/moderate complexity  EVALUATION COMPLEXITY: Moderate   GOALS:  SHORT TERM GOALS: Target date:  11/22/2022   Pt will be able to actively perform eversion for improved ankle position and strength.  Baseline: Goal status: PROGRESSING  2.  Pt will demo improved positioning of L ankle in W/C based upon visual observation for improved activation of peroneals and proper length tension relationship for ankle mm for improved stability.   Baseline:  Goal status:  GOAL MET  3.  Pt will demo improved ankle DF AROM to neutral for improved ankle mobility.  Baseline:  Goal status: PARTIALLY MET  4.  Pt will report at least a 25% improvement in mobility.  Baseline:  Goal status: GOAL MET Target date: 11/29/2022   LONG TERM GOALS: Target date: 04/10/2023  Pt will report she is able to perform transfers at home with grab bars independently.   Baseline:  Goal status:  ONGOING  2.  Pt will be able to perform transfers in the clinic with min assist.   Baseline:  Goal status:  ONGOING  3.  Pt will demo improved L ankle AROM to 5-10 deg in DF and 5 deg in Eve for improved ankle ROM and positioning with mobility.  Baseline:  Goal status: PROGRESSING  4.  Pt will be able to tolerate 4-5 mins of standing with min assist with UE support for improved tolerance with mobility and activities around her home.  Baseline:  Goal status: ONGOING  5.  Pt will be able to tolerate minimal to moderate manual resistance in L ankle DF and Eve for improved tolerance with Wb'ing and performance of transfers and  ambulation.  Baseline:  Goal status: ONGOING   NEW SHORT TERM GOALS:   Target date: 04/30/2023  Pt will perform transfers at home at min A level with use of grab bars Baseline: dependent for transfers with patient's boyfriend lifting her Goal status: INITIAL  2.  Pt will be able to tolerate standing with LRAD x 5 min with CGA to improve her tolerance for stance on LLE Baseline: able to stand x 1 min in // bars (5/20) Goal status: INITIAL  3.  FIST to be assessed and STG written Baseline: to be assessed Goal status: INITIAL   NEW LONG TERM GOALS:  Target date: 06/01/2023  Pt will be independent with final  HEP and plan to continue at community fitness level for improved strength, balance, transfers and gait. Baseline: independent with initial HEP and seeing a personal trainer 2-3x/week Goal status: INITIAL  2.  Pt will perform transfers at home at [place] level with use of grab bars Baseline: dependent for transfers with patient's boyfriend lifting her Goal status: INITIAL  3.  Pt will be able to stand to LRAD and take a few steps with min A to increase her independence with functional mobility and transfers Baseline: able to stand x 1 min in // bars (5/20) Goal status: INITIAL  4.  FIST to be assessed and LTG written Baseline: to be assessed Goal status: INITIAL   PLAN:  PT FREQUENCY: 1-2x/week  PT DURATION: 8 weeks + 8 weeks (recert)  PLANNED INTERVENTIONS: Therapeutic exercises, Therapeutic activity, Neuromuscular re-education, Balance training, Gait training, Patient/Family education, Self Care, Joint mobilization, DME instructions, Aquatic Therapy, Dry Needling, Electrical stimulation, Cryotherapy, Moist heat, Taping, Manual therapy, and Re-evaluation  PLAN FOR NEXT SESSION: assess FIST and write STG/LTG;  Ankle DF AROM and eversion PROM.  L quad and hip strength; Next: Work on transfer techniques, follow up about wheelchair (did they reach out to [employer] about  new tires?), did they schedule with Dr. Terrace Arabia for spasticity management? standing in // bars with weight shifting and possible steps, continue to assess for need for Swedish knee cages  Peter Congo, PT, DPT, CSRS 04/02/23 3:22 PM

## 2023-04-03 ENCOUNTER — Encounter (HOSPITAL_BASED_OUTPATIENT_CLINIC_OR_DEPARTMENT_OTHER): Payer: Medicare HMO | Admitting: Physical Therapy

## 2023-04-05 ENCOUNTER — Ambulatory Visit (HOSPITAL_BASED_OUTPATIENT_CLINIC_OR_DEPARTMENT_OTHER): Payer: Medicare HMO | Admitting: Physical Therapy

## 2023-04-06 ENCOUNTER — Ambulatory Visit: Payer: Medicare HMO | Admitting: Physical Therapy

## 2023-04-06 ENCOUNTER — Encounter: Payer: Self-pay | Admitting: Physical Therapy

## 2023-04-06 VITALS — BP 119/75 | HR 81

## 2023-04-06 DIAGNOSIS — M25672 Stiffness of left ankle, not elsewhere classified: Secondary | ICD-10-CM

## 2023-04-06 DIAGNOSIS — R2689 Other abnormalities of gait and mobility: Secondary | ICD-10-CM

## 2023-04-06 DIAGNOSIS — M6281 Muscle weakness (generalized): Secondary | ICD-10-CM | POA: Diagnosis not present

## 2023-04-06 DIAGNOSIS — M25572 Pain in left ankle and joints of left foot: Secondary | ICD-10-CM | POA: Diagnosis not present

## 2023-04-06 NOTE — Therapy (Signed)
OUTPATIENT PHYSICAL THERAPY TREATMENT   Patient Name: Robin Glenn MRN: [id number] DOB:09-24-1991, 32 y.o., female Today's Date: 04/06/2023  END OF SESSION:  PT End of Session - 04/06/23 1236     Visit Number 28    Number of Visits 44    Date for PT Re-Evaluation 06/11/23    Authorization Type [employer]ET[employer] MCR    Progress Note Due on Visit 30    PT Start Time 1235    PT Stop Time 1315    PT Time Calculation (min) 40 min    Equipment Utilized During Treatment Gait belt    Activity Tolerance Patient tolerated treatment well    Behavior During Therapy WFL for tasks assessed/performed              Past Medical History:  Diagnosis Date   [name] (HCC)    History reviewed. No pertinent surgical history. 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    Pt reports no acute changes since last visit, no falls. Is excited about going to the beach today. Is hoping to work some on standing. Patient states ankle stretches are going well at home.  PERTINENT HISTORY: [name]--pt is in a W/C.  She requires maximum assist with transfers.  Pt required assistance with sitting.   Pt is a FALL RISK.  Use a gait belt.   hypertrophic cardiomyopathy due to  Friedeich's ataxia, scoliosis  PAIN:  Are you having pain? Reports moderate levels of R knee pain with prolonged standing  PRECAUTIONS: Fall and Other: Requires extensive assistance with transfers  WEIGHT BEARING RESTRICTIONS: No  FALLS:  Has patient fallen in last 6 months? Yes. Number of falls 1 with transfer  LIVING ENVIRONMENT: Lives with: lives with their family Lives in: House/apartment Stairs: 2 story home Has following equipment at home: stair lift, grab bars, Rollator  OCCUPATION: Part time job as a Runner, broadcasting/film/video.    PLOF: Needs assistance with ADLs  Family assist with ADLs.  Pt in W/C.  Pt was able to walk a few mins with her personal trainer holding her up her prior to June.  Pt able to perform transfers independently at home with grab bars and required assistance out in the community.  Pt was able to take a few steps with rollator at home.    PATIENT GOALS: to improve strength of ankle, increase Wb'ing on L ankle, perform mobility without brace, walk a couple of mins on TM. "I have [name], so exercise is very important for me"  'being able to safely use a public restroom with a family member present in addition to trying to take a few assisted steps'   OBJECTIVE:   DIAGNOSTIC FINDINGS:  X rays of L ankle:  No fracture.  L ankle MRI on 10/02/2022: IMPRESSION: 1. Mild tenosynovitis of  the peroneal tendons. 2. Ligaments of the medial and lateral ankle ligaments are intact. 3. Small ankle joint effusion. 4. No evidence of fracture or osteonecrosis. 5. Mild edema of the Kager's fat pad. No fluid collection or hematoma.   TODAY'S TREATMENT:  TherAct:  Transfer to <> from [facility] to high low mat: Attempted use of slide board; however, fixed features on MWC make use of slide board challenging. Transitioned to squat pivot technique. Transfer over L onto mat and towards R off mat. Required modA from therapist.   Function In Sitting  Test (FIST)  (1/2 femur on surface; hips/knees flexed to 90deg sitting on high low mat)  SCORING KEY: 4 = Independent (completes task independently & successfully) 3 = Verbal Cues/Increased Time (completes task independently & successfully and only needs more time/cues) 2 = Upper Extremity Support (must use UE for support or assistance to complete successfully) 1 = Needs Assistance (unable to complete w/o physical assist; DOCUMENT LEVEL: min, mod, max) 0 = Dependent (requires complete physical assist; unable to complete successfully even w/ physical assist)  Randomly Administer Once Throughout Exam  1 - Anterior Nudge (superior sternum)  2 - Posterior Nudge (between scapular spines)  1 - Lateral Nudge (to dominant side at acromion)     2 - Static sitting (30 seconds)  3 - Sitting, shake 'no' (left and right)  2 - Sitting, eyes closed (30 seconds)   2 - Sitting, lift foot (dominant side, lift foot 1 inch twice)    1 - Pick up object from behind (object at midline, hands breadth posterior)  2 - Forward reach (use dominant arm, must complete full motion) 2 - Lateral reach (use dominant arm, clear opposite ischial tuberosity) 2 - Pick up object from floor (from between feet)   2 - Posterior scooting (move backwards 2 inches)  2 - Anterior scooting (move forward 2 inches)  2 - Lateral scooting (move to dominant side 2 inches)    TOTAL = 27/56  Notes/comments: Strong reliance on hands for UE support. Posterior pelvic tilt.   MCD > 5 points MCID for IP REHAB > 6 points  Standing in // bars: Sit to stand in // bars minA with custom L ASO donned L ankle blocked by therapist. Intermittent trial of therapist assisting patient into softer knee bend in bilateral LE but eventually required return to sitting to prevent hyperextension or backward collapse into flexion. Trialed Swedish knee cage on LLE. Ongoing L ankle inversion and knee valgus due to spasticity. Pt ambulate x 3 ft in // bars  with bilateral UE support + w/c follow. Trialed after seated break on RLE due to reports of RLE pain due to hyperextension for final trial in session. Patient reported reduced knee pain with use despite brace sizing.    PATIENT EDUCATION:  Education details:  Continue HEP, likely need for bracing to prevent knee hyperextension in future Person educated:  Pt and boyfriend Education method: Explanation, Demonstration, Tactile cues, and Verbal cues Education comprehension: verbalized understanding, returned demonstration, verbal cues required, tactile cues required, and needs further education  HOME EXERCISE PROGRAM: Access Code: [email or web address] URL: [email or web address] Date: 11/14/2022 Prepared by: Zebedee Iba  Exercises - Supine Ankle Circles  - 1 x daily - 7 x weekly - 2 sets - 10 reps - Supine Ankle Dorsiflexion and Plantarflexion AROM  - 1 x daily - 7 x weekly - 2 sets - 10 reps - Seated Hip Abduction with Resistance  - 1 x daily - 7 x  weekly - 3 sets - 10 reps - Standing Weight Shift  - 1 x daily - 7 x weekly - 1 sets - 1 reps - 5 min total hold   Verbally added 04/02/23: -Seated L Ankle Dorsiflexion Stretch 5 x 30 sec each -Seated L Ankle Eversion Stretch 5 x 30 sec each  ASSESSMENT:  CLINICAL IMPRESSION: Emphasis of skilled PT session on assessing patient's seated balance indicating strong reliance on UE compensatory pattern with FIST in addition to standing balance and a few steps in // bars. Standing continues to be limited by knee hyperextension bilaterally and ankle inversion and plantarflexion on LLE. Patient continues to benefit from swedish knee cage and would likely benefit from bilateral use to reduce pain and improve safety with standing. Will continue to assess. Continue POC.   OBJECTIVE IMPAIRMENTS: Abnormal gait, decreased activity tolerance, decreased balance, decreased coordination, decreased endurance, decreased mobility, difficulty walking, decreased ROM,  decreased strength, hypomobility, impaired flexibility, postural dysfunction, and pain.   ACTIVITY LIMITATIONS: standing, stairs, transfers, bed mobility, dressing, and locomotion level  PARTICIPATION LIMITATIONS: community activity and workout activities  PERSONAL FACTORS: 1 comorbidity: [name] and Progression of sx's and mobility deficits  are also affecting patient's functional outcome.   REHAB POTENTIAL: Fair due to Freidreich's ataxia, progression of sx's, and mobility deficits  CLINICAL DECISION MAKING: Evolving/moderate complexity  EVALUATION COMPLEXITY: Moderate   GOALS:  SHORT TERM GOALS: Target date:  11/22/2022   Pt will be able to actively perform eversion for improved ankle position and strength.  Baseline: Goal status: PROGRESSING  2.  Pt will demo improved positioning of L ankle in W/C based upon visual observation for improved activation of peroneals and proper length tension relationship for ankle mm for improved stability.   Baseline:  Goal status:  GOAL MET  3.  Pt will demo improved ankle DF AROM to neutral for improved ankle mobility.  Baseline:  Goal status: PARTIALLY MET  4.  Pt will report at least a 25% improvement in mobility.  Baseline:  Goal status: GOAL MET Target date: 11/29/2022   LONG TERM GOALS: Target date: 04/10/2023  Pt will report she is able to perform transfers at home with grab bars independently.   Baseline:  Goal status:  ONGOING  2.  Pt will be able to perform transfers in the clinic with min assist.   Baseline:  Goal status:  ONGOING  3.  Pt will demo improved L ankle AROM to 5-10 deg in DF and 5 deg in Eve for improved ankle ROM and positioning with mobility.  Baseline:  Goal status: PROGRESSING  4.  Pt will be able to tolerate 4-5 mins of standing with min assist with UE support for improved tolerance with mobility and activities around her home.  Baseline:  Goal status: ONGOING  5.  Pt will be able to  tolerate minimal to moderate manual resistance in L ankle DF and Eve for improved tolerance with Wb'ing and performance of transfers and ambulation.  Baseline:  Goal status: ONGOING   NEW SHORT TERM GOALS:   Target date: 04/30/2023  Pt will perform transfers at home at min A level with use of grab bars Baseline: dependent for transfers with patient's boyfriend lifting her Goal status: INITIAL  2.  Pt will be able to tolerate standing with LRAD x 5 min with CGA to improve her tolerance for stance on LLE Baseline: able to stand x 1 min in // bars (5/20) Goal status: INITIAL  3.  Patient  will improve Function In Sitting Test to 32/56 to indicate a clinically meaningful change in sensory, motor, proactive, reactive, and steady static seated balance needed to help maintain proper prolonged positioning while sitting in a wheelchair.   Baseline: 27/56 Goal status: INITIAL   NEW LONG TERM GOALS:  Target date: 06/01/2023  Pt will be independent with final HEP and plan to continue at community fitness level for improved strength, balance, transfers and gait. Baseline: independent with initial HEP and seeing a personal trainer 2-3x/week Goal status: INITIAL  2.  Pt will perform transfers at home at Supervision level with use of grab bars Baseline: dependent for transfers with patient's boyfriend lifting her Goal status: INITIAL  3.  Pt will be able to stand to LRAD and take a few steps with min A to increase her independence with functional mobility and transfers Baseline: able to stand x 1 min in // bars (5/20) Goal status: INITIAL  4.  Patient will improve Function In Sitting Test to 33/56 to indicate a clinically meaningful change in sensory, motor, proactive, reactive, and steady static seated balance needed to help maintain proper prolonged positioning while sitting in a wheelchair.   Baseline: 27/56 Goal status: INITIAL   PLAN:  PT FREQUENCY: 1-2x/week  PT DURATION: 8 weeks + 8  weeks (recert)  PLANNED INTERVENTIONS: Therapeutic exercises, Therapeutic activity, Neuromuscular re-education, Balance training, Gait training, Patient/Family education, Self Care, Joint mobilization, DME instructions, Aquatic Therapy, Dry Needling, Electrical stimulation, Cryotherapy, Moist heat, Taping, Manual therapy, and Re-evaluation  PLAN FOR NEXT SESSION: Ankle DF AROM and eversion PROM.  L quad and hip strength; Next: Work on transfer techniques, follow up about wheelchair (did they reach out to [employer] about new tires?), did they schedule with Dr. Terrace Arabia for spasticity management? standing in // bars with weight shifting and possible steps, continue to assess for need for Swedish knee cages  Maryruth Eve, PT, DPT 04/06/23 1:56 PM

## 2023-04-10 ENCOUNTER — Ambulatory Visit (HOSPITAL_BASED_OUTPATIENT_CLINIC_OR_DEPARTMENT_OTHER): Payer: Medicare HMO | Admitting: Physical Therapy

## 2023-04-12 ENCOUNTER — Ambulatory Visit: Payer: Medicare HMO | Admitting: Family Medicine

## 2023-04-16 ENCOUNTER — Ambulatory Visit: Payer: Medicare HMO | Attending: Podiatry | Admitting: Physical Therapy

## 2023-04-16 DIAGNOSIS — M25672 Stiffness of left ankle, not elsewhere classified: Secondary | ICD-10-CM | POA: Diagnosis not present

## 2023-04-16 DIAGNOSIS — R2689 Other abnormalities of gait and mobility: Secondary | ICD-10-CM | POA: Diagnosis not present

## 2023-04-16 DIAGNOSIS — M25572 Pain in left ankle and joints of left foot: Secondary | ICD-10-CM | POA: Diagnosis not present

## 2023-04-16 DIAGNOSIS — M6281 Muscle weakness (generalized): Secondary | ICD-10-CM | POA: Insufficient documentation

## 2023-04-16 NOTE — Therapy (Signed)
OUTPATIENT PHYSICAL THERAPY TREATMENT   Patient Name: Robin Glenn MRN: [id number] DOB:09/17/1991, 32 y.o., female Today's Date: 04/16/2023  END OF SESSION:  PT End of Session - 04/16/23 1403     Visit Number 29    Number of Visits 44    Date for PT Re-Evaluation 06/11/23    Authorization Type [employer]ET[employer] MCR    Progress Note Due on Visit 30    PT Start Time 1402    PT Stop Time 1442    PT Time Calculation (min) 40 min    Equipment Utilized During Treatment Gait belt    Activity Tolerance Patient tolerated treatment well    Behavior During Therapy WFL for tasks assessed/performed               Past Medical History:  Diagnosis Date   [name] (HCC)    No past surgical history on file. 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    Pt reports no major changes since last visit, went to the beach. Pt did a lot more standing and transfers to inaccessible bathroom during beach trip but didn't work on yoga, etc as much. Pt did work with her Systems analyst yesterday and worked on the leg press.  PERTINENT HISTORY: [name]--pt is in a W/C.  She requires maximum assist with transfers.  Pt required assistance with sitting.   Pt is a FALL RISK.   Use a gait belt.   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 Reports moderate levels of R knee pain with prolonged standing  PRECAUTIONS: Fall and Other: Requires extensive assistance with transfers  WEIGHT BEARING RESTRICTIONS: No  FALLS:  Has patient fallen in last 6 months? Yes. Number of falls 1 with transfer  LIVING ENVIRONMENT: Lives with: lives with their family Lives in: House/apartment Stairs: 2 story home Has following equipment at home: stair lift, grab bars, Rollator  OCCUPATION: Part time job as a Runner, broadcasting/film/video.    PLOF: Needs assistance with ADLs  Family assist with ADLs.  Pt in W/C.  Pt was able to walk a few mins with her personal trainer holding her up her prior to June.  Pt able to perform transfers independently at home with grab bars and required assistance out in the community.  Pt was able to take a few steps with rollator at home.    PATIENT GOALS: to improve strength of ankle, increase Wb'ing on L ankle, perform mobility without brace, walk a couple of mins on TM. "I have [name], so exercise is very important for me"  'being able to safely use a public restroom with a family member present in addition to trying to take a few assisted steps'   OBJECTIVE:   DIAGNOSTIC FINDINGS:  X rays of L  ankle:  No fracture.  L ankle MRI on 10/02/2022: IMPRESSION: 1. Mild tenosynovitis of the peroneal tendons. 2. Ligaments of the medial and lateral ankle ligaments are intact. 3. Small ankle joint effusion. 4. No evidence of fracture or osteonecrosis. 5. Mild edema of the Kager's fat pad. No fluid collection or hematoma.   TODAY'S TREATMENT:  TherAct: Standing in // bars: Sit to stand with CGA with custom L ASO donned, intermittent use of Swedish knee cage due to ongoing B knee hyperextension. Pt does exhibit improved L ankle control in standing with decreased eversion, does  have eversion with gait and trouble landing flat on L foot during gait.  Gait x 5 ft in // bars with mod A and w/c follow by Micah Noel***  Sit to stand outside // bars to simulate standing at grab bar in bathroom at home with assist from boyfriend***  Standing in // bars: Sit to stand in // bars minA with custom L ASO donned L ankle blocked by therapist. Intermittent trial of therapist assisting patient into softer knee bend in bilateral LE but eventually required return to sitting to prevent hyperextension or backward collapse into flexion. Trialed Swedish knee cage on LLE. Ongoing L ankle inversion and knee valgus due to spasticity. Pt ambulate x 3 ft in // bars with bilateral UE support + w/c follow. Trialed after seated break on RLE due to reports of RLE pain due to hyperextension for final trial in session. Patient reported reduced knee pain with use despite brace sizing.    PATIENT EDUCATION:  Education details: Continue HEP, likely need for bracing to prevent knee hyperextension in future, schedule with Dr. Terrace Arabia to discuss Botox vs baclofen, how to safely work on  Person educated:  Pt and boyfriend Education method: Programmer, multimedia, Facilities manager, Actor cues, and Verbal cues Education comprehension: verbalized understanding, returned demonstration, verbal cues required, tactile cues required, and needs further education  HOME EXERCISE PROGRAM: Access Code: [email or web address] URL: [email or web address] Date: 11/14/2022 Prepared by: Zebedee Iba  Exercises - Supine Ankle Circles  - 1 x daily - 7 x weekly - 2 sets - 10 reps - Supine Ankle Dorsiflexion and Plantarflexion AROM  - 1 x daily - 7 x weekly - 2 sets - 10 reps - Seated Hip Abduction with Resistance  - 1 x daily - 7 x weekly - 3 sets - 10 reps - Standing Weight Shift  - 1 x daily - 7 x weekly - 1 sets - 1 reps - 5 min total hold   Verbally added 04/02/23: -Seated L Ankle Dorsiflexion Stretch 5 x 30 sec each -Seated L Ankle  Eversion Stretch 5 x 30 sec each  ASSESSMENT:  CLINICAL IMPRESSION: Emphasis of skilled PT session on *** Continue POC.   OBJECTIVE IMPAIRMENTS: Abnormal gait, decreased activity tolerance, decreased balance, decreased coordination, decreased endurance, decreased mobility, difficulty walking, decreased ROM, decreased strength, hypomobility, impaired flexibility, postural dysfunction, and pain.   ACTIVITY LIMITATIONS: standing, stairs, transfers, bed mobility, dressing, and locomotion level  PARTICIPATION LIMITATIONS: community activity and workout activities  PERSONAL FACTORS: 1 comorbidity: [name] and Progression of sx's and mobility deficits  are also affecting patient's functional outcome.   REHAB POTENTIAL: Fair due to Freidreich's ataxia, progression of sx's, and mobility deficits  CLINICAL DECISION MAKING: Evolving/moderate complexity  EVALUATION COMPLEXITY: Moderate   GOALS:  NEW SHORT TERM GOALS:   Target date: 04/30/2023  Pt will perform transfers at home at min A level with use of grab bars Baseline: dependent for transfers with  patient's boyfriend lifting her Goal status: INITIAL  2.  Pt will be able to tolerate standing with LRAD x 5 min with CGA to improve her tolerance for stance on LLE Baseline: able to stand x 1 min in // bars (5/20) Goal status: INITIAL  3.  Patient will improve Function In Sitting Test to 32/56 to indicate a clinically meaningful change in sensory, motor, proactive, reactive, and steady static seated balance needed to help maintain proper prolonged positioning while sitting in a wheelchair.   Baseline: 27/56 Goal status: INITIAL   NEW LONG TERM GOALS:  Target date: 06/01/2023  Pt will be independent with final HEP and plan to continue at community fitness level for improved strength, balance, transfers and gait. Baseline: independent with initial HEP and seeing a personal trainer 2-3x/week Goal status: INITIAL  2.  Pt will  perform transfers at home at Supervision level with use of grab bars Baseline: dependent for transfers with patient's boyfriend lifting her Goal status: INITIAL  3.  Pt will be able to stand to LRAD and take a few steps with min A to increase her independence with functional mobility and transfers Baseline: able to stand x 1 min in // bars (5/20) Goal status: INITIAL  4.  Patient will improve Function In Sitting Test to 33/56 to indicate a clinically meaningful change in sensory, motor, proactive, reactive, and steady static seated balance needed to help maintain proper prolonged positioning while sitting in a wheelchair.   Baseline: 27/56 Goal status: INITIAL   PLAN:  PT FREQUENCY: 1-2x/week  PT DURATION: 8 weeks + 8 weeks (recert)  PLANNED INTERVENTIONS: Therapeutic exercises, Therapeutic activity, Neuromuscular re-education, Balance training, Gait training, Patient/Family education, Self Care, Joint mobilization, DME instructions, Aquatic Therapy, Dry Needling, Electrical stimulation, Cryotherapy, Moist heat, Taping, Manual therapy, and Re-evaluation  PLAN FOR NEXT SESSION: Ankle DF AROM and eversion PROM.  L quad and hip strength; Next: Work on transfer techniques, follow up about wheelchair (did they reach out to [employer] about new tires?), did they schedule with Dr. Terrace Arabia for spasticity management? standing in // bars with weight shifting and possible steps, continue to assess for need for Swedish knee cages***wants to work on core stability exercises based on FIST   Peter Congo, PT, DPT, CSRS  04/16/23 2:45 PM

## 2023-04-20 ENCOUNTER — Ambulatory Visit: Payer: Medicare HMO | Admitting: Physical Therapy

## 2023-04-20 DIAGNOSIS — M25572 Pain in left ankle and joints of left foot: Secondary | ICD-10-CM

## 2023-04-20 DIAGNOSIS — R2689 Other abnormalities of gait and mobility: Secondary | ICD-10-CM

## 2023-04-20 DIAGNOSIS — M25672 Stiffness of left ankle, not elsewhere classified: Secondary | ICD-10-CM | POA: Diagnosis not present

## 2023-04-20 DIAGNOSIS — M6281 Muscle weakness (generalized): Secondary | ICD-10-CM | POA: Diagnosis not present

## 2023-04-20 NOTE — Therapy (Signed)
OUTPATIENT PHYSICAL THERAPY TREATMENT-30TH VISIT PROGRESS NOTE***  Patient Name: Robin Glenn MRN: [id number] DOB:[birth year], 32 y.o., female Today's Date: 04/20/2023  Physical Therapy Progress Note   Dates of Reporting Period:*** - 04/20/23  See Note below for Objective Data and Assessment of Progress/Goals.  Thank you for the referral of this patient. Peter Congo, PT, DPT, CSRS   END OF SESSION:  PT End of Session - 04/20/23 1403     Visit Number 30    Number of Visits 44    Date for PT Re-Evaluation 06/11/23    Authorization Type [employer] MCR    Progress Note Due on Visit 30    PT Start Time 1402    PT Stop Time 1445    PT Time Calculation (min) 43 min    Equipment Utilized During Treatment Gait belt    Activity Tolerance Patient tolerated treatment well    Behavior During Therapy WFL for tasks assessed/performed                Past Medical History:  Diagnosis Date   [name] (HCC)    No past surgical history on file. 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    ***pt reports no changes since last session, did the leg press at the gym yesterday. No pain today.  PERTINENT HISTORY: [name]--pt is in a W/C.  She requires  maximum assist with transfers.  Pt required assistance with sitting.   Pt is a FALL RISK.  Use a gait belt.   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 Reports moderate levels of R knee pain with prolonged standing  PRECAUTIONS: Fall and Other: Requires extensive assistance with transfers  WEIGHT BEARING RESTRICTIONS: No  FALLS:  Has patient fallen in last 6 months? Yes. Number of falls 1 with transfer  LIVING ENVIRONMENT: Lives with: lives with their family Lives in: House/apartment Stairs: 2 story home Has following equipment at home: stair lift, grab bars, Rollator  OCCUPATION: Part time job as a Runner, broadcasting/film/video.    PLOF: Needs assistance with ADLs  Family assist with ADLs.  Pt in W/C.  Pt was able to walk a few mins with her personal trainer holding her up her prior to June.  Pt able to perform transfers independently at home with grab bars and required assistance out in the community.  Pt was able to take a few steps with rollator at home.    PATIENT GOALS: to improve strength of ankle, increase Wb'ing on L ankle, perform mobility without brace, walk a couple of mins on TM. "I have [name], so exercise is very important for me"  'being able to safely use a public restroom with a family member present in addition to trying to take a few assisted steps'  OBJECTIVE:   DIAGNOSTIC FINDINGS:  X rays of L ankle:  No fracture.  L ankle MRI on 10/02/2022: IMPRESSION: 1. Mild tenosynovitis of the peroneal tendons. 2. Ligaments of the medial and lateral ankle ligaments are intact. 3. Small ankle joint effusion. 4. No evidence of fracture or osteonecrosis. 5. Mild edema of the Kager's fat pad. No fluid collection or hematoma.   TODAY'S TREATMENT:  NMR: Standing in // bars 3 x 2 min with focus on L ankle control, equal WB through BLE  Transfers to/from mat table with assist from  [place].  Seated anterior leans with no UE support Seated lateral leans  Added to HEP***   PATIENT EDUCATION:  Education details: Continue HEP, likely need for bracing to prevent knee hyperextension in future, schedule with Dr. Terrace Arabia to discuss Botox vs baclofen, how to safely work on standing at home with grab bar*** Person educated:  Pt and boyfriend Education method: Explanation, Demonstration, Tactile cues, and Verbal cues Education comprehension: verbalized understanding, returned demonstration, verbal cues required, tactile cues required, and needs further education  HOME EXERCISE PROGRAM: Access Code: [email or web address] URL: [email or web address] Date: 11/14/2022 Prepared by: Zebedee Iba  Exercises - Supine Ankle Circles  - 1 x daily - 7 x weekly - 2 sets - 10 reps - Supine Ankle Dorsiflexion and Plantarflexion AROM  - 1 x daily - 7 x weekly - 2 sets - 10 reps - Seated Hip Abduction with Resistance  - 1 x daily - 7 x weekly - 3 sets - 10 reps - Standing Weight Shift  - 1 x daily - 7 x weekly - 1 sets - 1 reps - 5 min total hold - Seated Single Arm Elbow Extension Push-up on Table  - 1 x daily - 7 x weekly - 3 sets - 10 reps - Modified Eccentric Sit Up in Backless Chair  - 1 x daily - 7 x weekly - 3 sets - 10 reps  Verbally added 04/02/23: -Seated L Ankle Dorsiflexion Stretch 5 x 30 sec each -Seated L Ankle Eversion Stretch 5 x 30 sec each    ASSESSMENT:  CLINICAL IMPRESSION: Emphasis of skilled PT session on *** Continue POC.   OBJECTIVE IMPAIRMENTS: Abnormal gait, decreased activity tolerance, decreased balance, decreased coordination, decreased endurance, decreased mobility, difficulty walking, decreased ROM, decreased strength, hypomobility, impaired flexibility, postural dysfunction, and pain.   ACTIVITY LIMITATIONS: standing, stairs, transfers, bed mobility, dressing, and locomotion level  PARTICIPATION LIMITATIONS: community activity and workout  activities  PERSONAL FACTORS: 1 comorbidity: [name] and Progression of sx's and mobility deficits  are also affecting patient's functional outcome.   REHAB POTENTIAL: Fair due to Freidreich's ataxia, progression of sx's, and mobility deficits  CLINICAL DECISION MAKING: Evolving/moderate complexity  EVALUATION COMPLEXITY: Moderate   GOALS:  NEW SHORT TERM GOALS:   Target date: 04/30/2023  Pt will perform transfers at home at min A level with use of grab bars Baseline: dependent for transfers with patient's boyfriend lifting her Goal status: INITIAL  2.  Pt will be able to tolerate standing with LRAD x 5 min with CGA to improve her tolerance for stance on LLE Baseline: able to stand x 1 min in // bars (5/20) Goal status: INITIAL  3.  Patient will improve Function In Sitting Test to 32/56 to indicate a clinically meaningful change in sensory, motor, proactive, reactive, and steady static seated balance needed to help maintain proper prolonged positioning while sitting in a wheelchair.   Baseline: 27/56 Goal status: INITIAL  NEW LONG TERM GOALS:  Target date: 06/01/2023  Pt will be independent with final HEP and plan to continue at community fitness level for improved strength, balance, transfers and gait. Baseline: independent with initial HEP and seeing a personal trainer 2-3x/week Goal status: INITIAL  2.  Pt will perform transfers at home at Supervision level with use of grab bars Baseline: dependent for transfers with patient's boyfriend lifting her Goal status: INITIAL  3.  Pt will be able to stand to LRAD and take a few steps with min A to increase her independence with functional mobility and transfers Baseline: able to stand x 1 min in // bars (5/20) Goal status: INITIAL  4.  Patient will improve Function In Sitting Test to 33/56 to indicate a clinically meaningful change in sensory, motor, proactive, reactive, and steady static seated balance needed to help  maintain proper prolonged positioning while sitting in a wheelchair.   Baseline: 27/56 Goal status: INITIAL   PLAN:  PT FREQUENCY: 1-2x/week  PT DURATION: 8 weeks + 8 weeks (recert)  PLANNED INTERVENTIONS: Therapeutic exercises, Therapeutic activity, Neuromuscular re-education, Balance training, Gait training, Patient/Family education, Self Care, Joint mobilization, DME instructions, Aquatic Therapy, Dry Needling, Electrical stimulation, Cryotherapy, Moist heat, Taping, Manual therapy, and Re-evaluation  PLAN FOR NEXT SESSION: Ankle DF AROM and eversion PROM.  L quad and hip strength; Next: Work on transfer techniques, follow up about wheelchair (did they reach out to [employer] about new tires?), did they schedule with Dr. Terrace Arabia for spasticity management? (Ask for Swedish knee cages) standing in // bars with weight shifting and possible steps, wants to work on core stability exercises based on FIST***   Peter Congo, PT, DPT, CSRS  04/20/23 2:49 PM

## 2023-04-23 ENCOUNTER — Ambulatory Visit: Payer: Medicare HMO | Admitting: Physical Therapy

## 2023-04-27 ENCOUNTER — Ambulatory Visit: Payer: Medicare HMO | Admitting: Physical Therapy

## 2023-04-30 ENCOUNTER — Ambulatory Visit: Payer: Medicare HMO | Admitting: Physical Therapy

## 2023-05-04 ENCOUNTER — Encounter: Payer: Self-pay | Admitting: Physical Therapy

## 2023-05-04 ENCOUNTER — Ambulatory Visit: Payer: Medicare HMO | Admitting: Physical Therapy

## 2023-05-04 VITALS — BP 115/86 | HR 64

## 2023-05-04 DIAGNOSIS — M25672 Stiffness of left ankle, not elsewhere classified: Secondary | ICD-10-CM

## 2023-05-04 DIAGNOSIS — M25572 Pain in left ankle and joints of left foot: Secondary | ICD-10-CM

## 2023-05-04 DIAGNOSIS — R2689 Other abnormalities of gait and mobility: Secondary | ICD-10-CM

## 2023-05-04 DIAGNOSIS — M6281 Muscle weakness (generalized): Secondary | ICD-10-CM | POA: Diagnosis not present

## 2023-05-04 NOTE — Therapy (Signed)
OUTPATIENT PHYSICAL THERAPY TREATMENT  Patient Name: Honestie Kulik MRN: [id number] DOB:04-Jan-1991, 32 y.o., female Today's Date: 05/04/2023  Physical Therapy Progress Note   Dates of Reporting Period: 02/13/23 - 04/20/23  See Note below for Objective Data and Assessment of Progress/Goals.  Thank you for the referral of this patient. Peter Congo, PT, DPT, CSRS   END OF SESSION:  PT End of Session - 05/04/23 1409     Visit Number 31    Number of Visits 44    Date for PT Re-Evaluation 06/11/23    Authorization Type [employer] MCR    Progress Note Due on Visit 30    PT Start Time 1408   session shortened due to patient arriving late to session   PT Stop Time 1443    PT Time Calculation (min) 35 min    Equipment Utilized During Treatment Gait belt    Activity Tolerance Patient tolerated treatment well    Behavior During Therapy WFL for tasks assessed/performed                Past Medical History:  Diagnosis Date   [name] (HCC)    History reviewed. No pertinent surgical history. 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    Pt reports one near fall from her chair due to losing her balance forward and Micah Noel was able to help stabilize her.  Patient has not followed up with Dr.Yan or [employer].   PERTINENT HISTORY: [name]--pt is in a W/C.  She requires maximum assist with transfers.  Pt required assistance with sitting.   Pt is a FALL RISK.  Use a gait belt.   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 Reports moderate knee pain with some activities, it is currently doing well  PRECAUTIONS: Fall and Other: Requires extensive assistance with transfers  WEIGHT BEARING RESTRICTIONS: No  FALLS:  Has patient fallen in last 6 months? Yes. Number of falls 1 with transfer  LIVING ENVIRONMENT: Lives with: lives with their family Lives in: House/apartment Stairs: 2 story home Has following equipment at home: stair lift, grab bars, Rollator  OCCUPATION: Part time job as a Runner, broadcasting/film/video.    PLOF: Needs assistance with ADLs  Family assist with ADLs.  Pt in W/C.  Pt was able to walk a few mins with her personal trainer holding her up her prior to June.  Pt able to perform transfers independently at home with grab bars and required assistance out in the community.  Pt was able to take a few steps with rollator at home.    PATIENT GOALS: to improve strength of ankle, increase Wb'ing on L ankle, perform mobility without brace, walk a couple of mins on TM. "I have [name], so exercise is very important for me"  '  being able to safely use a public restroom with a family member present in addition to trying to take a few assisted steps'   OBJECTIVE:   DIAGNOSTIC FINDINGS:  X rays of L ankle:  No fracture.  L ankle MRI on 10/02/2022: IMPRESSION: 1. Mild tenosynovitis of the peroneal tendons. 2. Ligaments of the medial and lateral ankle ligaments are intact. 3. Small ankle joint effusion. 4. No evidence of fracture or osteonecrosis. 5. Mild edema of the Kager's fat pad. No fluid collection or hematoma.   TODAY'S TREATMENT:  Self  Care: Discussed continued need for patient to follow up with Dr. Terrace Arabia about spasticity management. Also, reminded of need for requesting about bilateral swedish knee cages at this future visit as well. Also reinforced need for patient to contact [employer] about wheelchair wheels. Provided note with reminders of these tasks.   NMR:  Patient donning L AFO  Between // bars: Sit to stand x 5 times with CGA and minA to eccentrically lower with manual assist from therapist to block knee and ankle  Fwd progression with min/modA from therapist 2 x 6 feet with UE support in // bars (requires assistance to help progress bilateral LE forward and improve foot alignment) - with Micah Noel providing wheelchair follow Attempted use of Swedish knee cage on R side to minimize pain, unable to maintain brace as it is too large for patient with ambulation (removed)     PATIENT EDUCATION:  Education details: Self care as noted above + Continue HEP Person educated:  Pt and boyfriend Education method: Explanation, Demonstration, Tactile cues, Verbal cues, and Handouts Education comprehension: verbalized understanding, returned demonstration, verbal cues required, tactile cues required, and needs further education  HOME EXERCISE PROGRAM: Access Code: [email or web address] URL: [email or web address] Date: 11/14/2022 Prepared by: Zebedee Iba  Exercises - Supine Ankle Circles  - 1 x daily - 7 x weekly - 2 sets - 10 reps - Supine Ankle Dorsiflexion and Plantarflexion AROM  - 1 x daily - 7 x weekly - 2 sets - 10 reps - Seated Hip Abduction with Resistance  - 1 x daily - 7 x weekly - 3 sets - 10 reps - Standing Weight Shift  - 1 x daily - 7 x weekly - 1 sets - 1 reps - 5 min total hold - Seated Single Arm Elbow Extension Push-up on Table  - 1 x daily - 7 x weekly - 3 sets - 10 reps - Modified Eccentric Sit Up in Backless Chair  - 1 x daily - 7 x weekly - 3 sets - 10 reps  Verbally added 04/02/23: -Seated L Ankle  Dorsiflexion Stretch 5 x 30 sec each -Seated L Ankle Eversion Stretch 5 x 30 sec each -Perform sit to stands to grab bar in bathroom with focus on L ankle stability   ASSESSMENT:  CLINICAL IMPRESSION: Session emphasized review of to do tasks for patient for medical management follow up and sit to stands and assisted forward steps in // bars. Patient demonstrates increased ease with initial sit to stand. Increased difficulty and need for management of LE with fatigue but is able to complete 2 x 6 feet of forward progression with min-modA. Continue POC.   OBJECTIVE IMPAIRMENTS: Abnormal gait, decreased activity tolerance, decreased balance, decreased coordination, decreased endurance, decreased mobility, difficulty walking, decreased ROM, decreased strength, hypomobility, impaired flexibility, postural dysfunction, and pain.   ACTIVITY LIMITATIONS: standing, stairs, transfers, bed mobility, dressing, and locomotion level  PARTICIPATION LIMITATIONS: community activity and workout activities  PERSONAL  FACTORS: 1 comorbidity: [name] and Progression of sx's and mobility deficits  are also affecting patient's functional outcome.   REHAB POTENTIAL: Fair due to Freidreich's ataxia, progression of sx's, and mobility deficits  CLINICAL DECISION MAKING: Evolving/moderate complexity  EVALUATION COMPLEXITY: Moderate   GOALS:  NEW SHORT TERM GOALS:   Target date: 04/30/2023  Pt will perform transfers at home at min A level with use of grab bars Baseline: dependent for transfers with patient's boyfriend lifting her Goal status: INITIAL  2.  Pt will be able to tolerate standing with LRAD x 5 min with CGA to improve her tolerance for stance on LLE Baseline: able to stand x 1 min in // bars (5/20) Goal status: INITIAL  3.  Patient will improve Function In Sitting Test to 32/56 to indicate a clinically meaningful change in sensory, motor, proactive, reactive, and steady static seated  balance needed to help maintain proper prolonged positioning while sitting in a wheelchair.   Baseline: 27/56 Goal status: INITIAL   NEW LONG TERM GOALS:  Target date: 06/01/2023  Pt will be independent with final HEP and plan to continue at community fitness level for improved strength, balance, transfers and gait. Baseline: independent with initial HEP and seeing a personal trainer 2-3x/week Goal status: INITIAL  2.  Pt will perform transfers at home at Supervision level with use of grab bars Baseline: dependent for transfers with patient's boyfriend lifting her Goal status: INITIAL  3.  Pt will be able to stand to LRAD and take a few steps with min A to increase her independence with functional mobility and transfers Baseline: able to stand x 1 min in // bars (5/20) Goal status: INITIAL  4.  Patient will improve Function In Sitting Test to 33/56 to indicate a clinically meaningful change in sensory, motor, proactive, reactive, and steady static seated balance needed to help maintain proper prolonged positioning while sitting in a wheelchair.  Baseline: 27/56 Goal status: INITIAL   PLAN:  PT FREQUENCY: 1-2x/week  PT DURATION: 8 weeks + 8 weeks (recert)  PLANNED INTERVENTIONS: Therapeutic exercises, Therapeutic activity, Neuromuscular re-education, Balance training, Gait training, Patient/Family education, Self Care, Joint mobilization, DME instructions, Aquatic Therapy, Dry Needling, Electrical stimulation, Cryotherapy, Moist heat, Taping, Manual therapy, and Re-evaluation  PLAN FOR NEXT SESSION: Ankle DF AROM and eversion PROM.  L quad and hip strength; Next: Work on transfer techniques, follow up about wheelchair (did they reach out to [employer] about new tires?), did they schedule with Dr. Terrace Arabia for spasticity management? (Ask for Swedish knee cages) standing in // bars with weight shifting and possible steps, how are core stability exercises? Tall kneel?  Maryruth Eve, PT,  DPT  05/04/23 4:26 PM

## 2023-05-07 ENCOUNTER — Ambulatory Visit: Payer: Medicare HMO | Admitting: Physical Therapy

## 2023-05-08 ENCOUNTER — Telehealth: Payer: Self-pay | Admitting: Physical Therapy

## 2023-05-08 NOTE — Telephone Encounter (Signed)
Called patient asking about canceling a few weeks of appointments. Patient states she is going on hold until her follow up with Dr. Terrace Arabia. Informed patient she may need a new referral depending on how long she is out. Patient verbalized understanding.  Maryruth Eve, PT, DPT

## 2023-05-11 ENCOUNTER — Ambulatory Visit: Payer: Medicare HMO | Admitting: Physical Therapy

## 2023-05-14 ENCOUNTER — Ambulatory Visit: Payer: Medicare HMO | Admitting: Physical Therapy

## 2023-05-14 ENCOUNTER — Other Ambulatory Visit: Payer: Self-pay

## 2023-05-14 MED ORDER — SKYCLARYS 50 MG PO CAPS
150.0000 mg | ORAL_CAPSULE | Freq: Every day | ORAL | 1 refills | Status: DC
Start: 2023-05-14 — End: 2024-03-10

## 2023-05-18 ENCOUNTER — Ambulatory Visit: Payer: Medicare HMO | Admitting: Physical Therapy

## 2023-05-21 ENCOUNTER — Ambulatory Visit: Payer: Medicare HMO | Admitting: Physical Therapy

## 2023-05-25 ENCOUNTER — Ambulatory Visit: Payer: Medicare HMO | Admitting: Physical Therapy

## 2023-05-28 ENCOUNTER — Ambulatory Visit: Payer: Medicare HMO | Admitting: Physical Therapy

## 2023-05-28 ENCOUNTER — Ambulatory Visit: Payer: Medicare HMO | Admitting: Neurology

## 2023-05-31 ENCOUNTER — Encounter: Payer: Self-pay | Admitting: Physical Therapy

## 2023-05-31 NOTE — Therapy (Signed)
PHYSICAL THERAPY DISCHARGE SUMMARY  Visits from Start of Care: 31  Current functional level related to goals / functional outcomes: Not able to assess; patient D/C until see neurologist to address ankle spasticity    Remaining deficits: Not able to assess; patient D/C until see neurologist to address ankle spasticity    Education / Equipment: Will require new referral; patient made aware   Patient agrees to discharge. Patient goals were not met. Patient is being discharged due to  needing to get in to neurologist and can't get in until September; will require new referral. .

## 2023-06-01 ENCOUNTER — Ambulatory Visit: Payer: Medicare HMO | Admitting: Physical Therapy

## 2023-07-17 ENCOUNTER — Encounter: Payer: Self-pay | Admitting: Neurology

## 2023-07-17 ENCOUNTER — Ambulatory Visit: Payer: Medicare HMO | Admitting: Neurology

## 2023-07-18 ENCOUNTER — Ambulatory Visit (HOSPITAL_BASED_OUTPATIENT_CLINIC_OR_DEPARTMENT_OTHER): Payer: Medicare HMO | Admitting: Cardiology

## 2023-07-18 ENCOUNTER — Encounter (HOSPITAL_BASED_OUTPATIENT_CLINIC_OR_DEPARTMENT_OTHER): Payer: Self-pay | Admitting: Cardiology

## 2023-07-18 VITALS — BP 110/80 | HR 76 | Ht 69.0 in | Wt 150.0 lb

## 2023-07-18 DIAGNOSIS — I422 Other hypertrophic cardiomyopathy: Secondary | ICD-10-CM | POA: Diagnosis not present

## 2023-07-18 DIAGNOSIS — I43 Cardiomyopathy in diseases classified elsewhere: Secondary | ICD-10-CM

## 2023-07-18 DIAGNOSIS — G1111 Friedreich ataxia: Secondary | ICD-10-CM

## 2023-07-18 DIAGNOSIS — Z8249 Family history of ischemic heart disease and other diseases of the circulatory system: Secondary | ICD-10-CM

## 2023-07-18 NOTE — Patient Instructions (Addendum)
Medication Instructions:  Your physician recommends that you continue on your current medications as directed. Please refer to the Current Medication list given to you today.  *If you need a refill on your cardiac medications before your next appointment, please call your pharmacy*  Lab Work: NONE  Testing/Procedures: Your physician has requested that you have an echocardiogram. Echocardiography is a painless test that uses sound waves to create images of your heart. It provides your doctor with information about the size and shape of your heart and how well your heart's chambers and valves are working. This procedure takes approximately one hour. There are no restrictions for this procedure. Please do NOT wear cologne, perfume, aftershave, or lotions (deodorant is allowed). Please arrive 15 minutes prior to your appointment time. 08/08/2023 1:00 pm   Follow-Up: At [facility], you and your health needs are our priority.  As part of our continuing mission to provide you with exceptional heart care, we have created designated Provider Care Teams.  These Care Teams include your primary Cardiologist (physician) and Advanced Practice Providers (APPs -  Physician Assistants and Nurse Practitioners) who all work together to provide you with the care you need, when you need it.  We recommend signing up for the patient portal called "MyChart".  Sign up information is provided on this After Visit Summary.  MyChart is used to connect with patients for Virtual Visits (Telemedicine).  Patients are able to view lab/test results, encounter notes, upcoming appointments, etc.  Non-urgent messages can be sent to your provider as well.   To learn more about what you can do with MyChart, go to [email or web address].    Your next appointment:   12 month(s)  The format for your next appointment:   In Person  Provider:   Jodelle Red, MD

## 2023-07-18 NOTE — Progress Notes (Signed)
Cardiology Office Note:  .    Date:  07/18/2023  ID:  Robin Glenn, DOB [birth year], MRN [id number] PCP: Levert Feinstein, MD  [facility] Providers Cardiologist:  Jodelle Red, MD     History of Present Illness: .    Robin Glenn is a 32 y.o. 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 I initially met her 05/26/2022 as a new consult at the request of Levert Feinstein, MD for the evaluation and management of hypertrophy due to Friedrich's ataxia. Previously followed by Dr. Donnie Aho.   Was on interferon-gamma therapy for a year, there is some evidence that this can be related to LVH. Was on this 2016-2017.   At her visit 05/2022, she was about to start omaveloxolone (skyclarys) within a month. She was working with a physical therapist and tried to remain active at the gym. She had injured her left ankle, was in a boot. We updated her echocardiogram which revealed moderate to severe concentric LVH (walls 1.7 cm), EF normal but stroke volume 57 ml. Chordal SAM and mid cavitary gradient 17 mmHg. She wanted to keep working on her exercise/strengthening. We referred her to [facility].   Today, she is accompanied by her father and boyfriend. Last summer she tore a ligament in her ankle, and had a rough year of recovery with 6 months of physical therapy. At this time she feels she has turned a corner, able to bear weight with both of her feet flat on the ground.  She has been on [facility] for a little over a year now, she believes it is helping a little bit. FA symptoms seem to be somewhat improved. Her father has noticed improvements such as in her writing.   She has been working out frequently. During some more intense exercises such as pull ups, her heart will start beating very quickly, causing her to rest for a moment until her symptoms subside. These racing heart rates do not occur at rest.   Occasionally she may wake up some mornings  with a dizzy spell that she describes as feeling "almost like a hangover." No passing out or blacking out.    Sometimes while sitting on the couch, her mind becomes restless; subsequently notices having muscle spasms in her legs.  She denies any chest pain, shortness of breath, peripheral edema, headaches, orthopnea, or PND.  ROS:  Please see the history of present illness. ROS otherwise negative except as noted.  (+) Rapid heart rates with intense exercise (+) Occasional dizzy spells (+) Muscle spasms of bilateral LE  Studies Reviewed: Marland Kitchen         Physical Exam:    VS:  There were no vitals taken for this visit.   Wt Readings from Last 3 Encounters:  05/26/22 150 lb (68 kg)  12/14/20 135 lb (61.2 kg)    GEN: Well nourished, well developed in no acute distress HEENT: Normal, moist mucous membranes NECK: No JVD CARDIAC: regular rhythm, normal S1 and S2, no rubs or gallops. No murmur. VASCULAR: Radial and DP pulses 2+ bilaterally. No carotid bruits RESPIRATORY:  Rhonchi of right lung; No rales or wheezing.   ABDOMEN: Soft, non-tender, non-distended MUSCULOSKELETAL:  Ambulates with wheelchair. SKIN: Warm and dry, no edema NEUROLOGIC:  Alert and oriented x 3 PSYCHIATRIC:  Normal affect   ASSESSMENT AND PLAN: .    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    Family history of CAD, HOCM -father is also my patient -Friedreichs ataxia associated with more of a septal asymmetric hypertrophy. She has concentric LVH, more consistent with her family history of HCM, but cannot exclude involvement from FA -no documented arrhythmias, no syncope   Cardiac risk counseling and prevention recommendations: -recommend heart healthy/Mediterranean diet, with whole grains, fruits, vegetable, fish, lean meats, nuts, and olive oil. Limit salt. -recommend moderate walking, 3-5 times/week for  30-50 minutes each session. Aim for at least 150 minutes.week. Goal should be pace of 3 miles/hours, or walking 1.5 miles in 30 minutes -recommend avoidance of tobacco products. Avoid excess alcohol. -ASCVD risk score: The ASCVD Risk score (Arnett DK, et al., 2019) failed to calculate for the following reasons:   The 2019 ASCVD risk score is only valid for ages 62 to 80  Dispo: Follow-up in 1 year, or sooner as needed.  I,Mathew Stumpf,acting as a Neurosurgeon for [employer], MD.,have documented all relevant documentation on the behalf of Jodelle Red, MD,as directed by  Jodelle Red, MD while in the presence of Jodelle Red, MD.  I, Jodelle Red, MD, have reviewed all documentation for this visit. The documentation on 08/27/23 for the exam, diagnosis, procedures, and orders are all accurate and complete.   Signed, Jodelle Red, MD

## 2023-08-08 ENCOUNTER — Ambulatory Visit (HOSPITAL_BASED_OUTPATIENT_CLINIC_OR_DEPARTMENT_OTHER): Payer: Medicare HMO

## 2023-08-08 DIAGNOSIS — G1111 Friedreich ataxia: Secondary | ICD-10-CM

## 2023-08-08 DIAGNOSIS — I43 Cardiomyopathy in diseases classified elsewhere: Secondary | ICD-10-CM

## 2023-08-08 LAB — ECHOCARDIOGRAM COMPLETE
Area-P 1/2: 5.9 cm2
Est EF: >75
S' Lateral: 1.56 cm

## 2023-08-13 ENCOUNTER — Telehealth: Payer: Self-pay

## 2023-08-13 NOTE — Telephone Encounter (Signed)
Call to patient, no answer. Left message to return call to office. F[facility]ed medication order to [facility].

## 2023-08-28 ENCOUNTER — Telehealth: Payer: Self-pay

## 2023-08-28 NOTE — Telephone Encounter (Signed)
PHONE ROOM: Please assist inrescheduling.  Thanks,  Production assistant, radio

## 2023-08-28 NOTE — Telephone Encounter (Signed)
-----   Message from Levert Feinstein sent at 08/28/2023 10:22 AM EDT ----- Have not seen patient since NOv 2023, please schedule a virtual visit with me before end of 2024 for [place] to continue refill her meds here. ----- Message ----- From: Jodelle Red, MD Sent: 08/27/2023   9:45 PM EDT To: Levert Feinstein, MD

## 2023-09-12 ENCOUNTER — Telehealth: Payer: Self-pay

## 2023-10-17 ENCOUNTER — Telehealth: Payer: Self-pay

## 2023-10-17 NOTE — Telephone Encounter (Signed)
Order for skyclarys 50 mg faxed po tid

## 2023-11-01 ENCOUNTER — Other Ambulatory Visit (HOSPITAL_COMMUNITY): Payer: Self-pay

## 2023-11-15 ENCOUNTER — Telehealth: Payer: Self-pay | Admitting: Neurology

## 2023-11-15 NOTE — Telephone Encounter (Signed)
 Pharmacy called requesting a refill for the pt's  Omaveloxolone (SKYCLARYS) 50 MG CAPS  Please advise.

## 2023-11-15 NOTE — Telephone Encounter (Signed)
 I called [facility] back and spoke with Candace. I provided 1 verbal order per Dr Onita for refill of Skyclarys  (50 mg, taking 3 capsules daily) #90 with 0 refills. Pt past due for appt. I asked the phone room to reach out to pt to get her scheduled for a follow-up.

## 2023-11-26 NOTE — Telephone Encounter (Signed)
 Robin Glenn from [facility](ph#[phone]) reports that a PA is needed, he provided the Cover My Meds [id number]

## 2023-11-26 NOTE — Telephone Encounter (Signed)
 PA SENT

## 2023-11-30 ENCOUNTER — Encounter: Payer: Self-pay | Admitting: Neurology

## 2023-12-03 ENCOUNTER — Telehealth: Payer: Self-pay

## 2023-12-03 ENCOUNTER — Other Ambulatory Visit (HOSPITAL_COMMUNITY): Payer: Self-pay

## 2023-12-03 NOTE — Telephone Encounter (Signed)
     I was asked to do a PA however per test claim there is already an active PA on file till 11/12/2024.

## 2024-03-10 ENCOUNTER — Encounter: Payer: Self-pay | Admitting: Neurology

## 2024-03-10 ENCOUNTER — Ambulatory Visit: Admitting: Neurology

## 2024-03-10 VITALS — BP 108/72 | Ht 69.0 in | Wt 150.0 lb

## 2024-03-10 DIAGNOSIS — M25572 Pain in left ankle and joints of left foot: Secondary | ICD-10-CM | POA: Diagnosis not present

## 2024-03-10 DIAGNOSIS — G8929 Other chronic pain: Secondary | ICD-10-CM | POA: Diagnosis not present

## 2024-03-10 DIAGNOSIS — G1111 Friedreich ataxia: Secondary | ICD-10-CM | POA: Diagnosis not present

## 2024-03-10 MED ORDER — SKYCLARYS 50 MG PO CAPS: 150.0000 mg | ORAL_CAPSULE | Freq: Every day | ORAL | 4 refills | Status: DC

## 2024-03-10 NOTE — Progress Notes (Signed)
 Chief Complaint  Patient presents with   Follow-up    Pt in 14, here with [facility].  Pt is here following up on friedreich's ataxia. Pt states she needs refill on skyclarys .       ASSESSMENT AND PLAN  Robin Glenn is a 33 y.o. female  Robin Glenn ataxia  Has been under the care of [place] specialist Dr. Clora Glenn, started [facility] since August 2023, tolerating it well  She will continue follow-up with Dr. Clora Glenn  Laboratory evaluation including to rule out abnormal liver functional test, refilled her prescription  Known history of cardiac hypertrophy,  Echocardiogram in September 2024 showed hypertrophic cardiomyopathy, normal ejection fraction, but significant concentric hypertrophy  Worsening left ankle pain,  Much improved, still have tendency for left ankle plantarflexion, advise a warm compression, stretch  Return To Clinic With NP In 6 Months  DIAGNOSTIC DATA (LABS, IMAGING, TESTING) - I reviewed patient records, labs, notes, testing and imaging myself where available.   MEDICAL HISTORY:  Robin Glenn is a 33 year old female, seen in request by her primary care nurse practitioner [facility] at [facility] for evaluation of Robin Glenn ataxia, she is accompanied by her parents at today's visit April 24, 2022  I reviewed and summarized the referring note. PMHX.  She developed gradual onset of clumsiness, gait abnormality around age 28, progressively worse, by age 61,  family went to [facility], was diagnosed with Robin Glenn ataxia based on genetic testing,  Father brought in note by [facility] physician Dr.  Isabella Glenn, diagnosis in November 2009, [name], scoliosis, she was referred Robin Glenn ataxia program at [facility] with Dr. Clora Glenn, she has been under Dr. Kandi Glenn care since then, was participated in 2 clinical trial from 2012-2013, then 2016-2017, without helping her symptoms,  Her disease continued to  progress, increased gait abnormality, she rely on her walker from age [age], then went to wheelchair,  She lives at home, work part-time job, is a Chartered loss adjuster, motivation speaker, kept herself busy, denies depression, has boyfriend, on contraceptives, in addition she workout 3 times every week with a Systems analyst since 2013,  Last follow-up with Dr. Clora Glenn was before pandemic, MRI cardiac morphology in July 2018 showed normal ejection fraction 62%, marked increase in left ventricular mass, marked fairly asymmetric left ventricular hypertrophy with septum being 22 mm posterior lateral wall 12 mm, moderate left atrial enlargement,  She has significant scoliosis, able to lie flat sleep using 1 pillow, bilateral lower extremity hot sensation, to cool down her leg, denies bowel or bladder incontinence   She has slow worsening dysarthria, mild dysphagia, choking on her food occasionally, needing help cutting her food sometimes, needing help transfer   Update September 21, 2022: She start Skycaryls 150mg  daily since end of July 2023, tolerating it well,  Had cardiology evaluation, normal CMP, CBC, TSH, ALT, BMP, I reviewed cardiology evaluation by Dr. Veryl Glenn May 26, 2022, echocardiogram showed severe concentric hypertrophy up to 17 mm, Mid cavitary gradient up to 12 mm HG. Findings consistent with known hypertrophic cardiomyopathy. Left ventricular ejection fraction, by estimation, is 65 to 70%. The left ventricle has normal function. The left ventricle has no regional wall motion abnormalities. There is severe concentric left ventricular hypertrophy. Left ventricular diastolic parameters were normal. 1.Right ventricular systolic function is normal. The right ventricular size is normal.   Since  2021, she noticed left ankle inversion, plantarflexion, few months ago in June 2023, he developed significant ankle pain, could  no longer bear weight, was seen by podiatrist, x-ray showed no  broken bone, wear boot for few weeks, but since then, it has become increased difficulty for her to bear weight with her left ankle, give out underneath her, it has affected her life quality  Family is wondering if Botox injection would be the solution, she has fixed contraction of left ankle, consistent with musculoskeletal etiology, not a good candidate for botulism toxin injection, suggested warm compression, massage  UPDATE [name]: She came in wheelchair, with her caregiver and boyfriend, she is overall stable, tolerating Skyclarys , feel her voice is stronger, she continued to workout regularly at the [facility], also doing home exercise, volunteer at her church, like to write working in front of computer    PHYSICAL EXAM:   Vitals:   03/10/24 1256  BP: 108/72  Weight: 150 lb (68 kg)  Height: 5\' 9"  (1.753 m)    Body mass index is 22.15 kg/m.  PHYSICAL EXAMNIATION:  Gen: NAD, conversant, well nourised, well groomed                     Cardiovascular: Regular rate rhythm, no peripheral edema, warm, nontender. Eyes: Conjunctivae clear without exudates or hemorrhage Neck: Supple, no carotid bruits. Pulmonary: Clear to auscultation bilaterally  Skeleton: Significant scoliosis  NEUROLOGICAL EXAM:  MENTAL STATUS: Speech/cognition: Slow spastic dysarthria CRANIAL NERVES: CN II: Visual fields are full to confrontation. Pupils are round equal and briskly reactive to light. CN III, IV, VI: extraocular movement are normal. No ptosis. CN V: Facial sensation is intact to light touch CN VII: Face is symmetric with normal eye closure  CN VIII: Hearing is normal to causal conversation. CN IX, X: Phonation is normal. CN XI: Head turning and shoulder shrug are intact  MOTOR: Bilateral upper and lower extremity proximal and distal muscle strengths 4 out of 5, significant dysmetria, tendency for left ankle plantarflexion, but better range of motion  REFLEXES: Areflexia  SENSORY:  Intact to light touch, vibratory sensation and pinprick  COORDINATION: mild to moderate truncal ataxia, significant bilateral upper and lower extremity dysmetria   GAIT/STANCE: Deferred  REVIEW OF SYSTEMS:  Full 14 system review of systems performed and notable only for as above All other review of systems were negative.   ALLERGIES: No Known Allergies  HOME MEDICATIONS: Current Outpatient Medications  Medication Sig Dispense Refill   Omaveloxolone  (SKYCLARYS ) 50 MG CAPS Take 150 mg by mouth daily. 90 capsule 1   TRI-ESTARYLLA 0.18/0.215/0.25 MG-35 MCG tablet Take 1 tablet by mouth daily.     No current facility-administered medications for this visit.    PAST MEDICAL HISTORY: Past Medical History:  Diagnosis Date   [name] (HCC)     PAST SURGICAL HISTORY: History reviewed. No pertinent surgical history.  FAMILY HISTORY: Family History  Problem Relation Age of Onset   Obsessive Compulsive Disorder Father    Coronary artery disease Father     SOCIAL HISTORY: Social History   Socioeconomic History   Marital status: Single    Spouse name: Not on file   Number of children: Not on file   Years of education: Not on file   Highest education level: Not on file  Occupational History   Not on file  Tobacco Use   Smoking status: Former    Current packs/day: 0.00    Types: Cigarettes    Quit date: 12/06/2020    Years since quitting: 3.2   Smokeless tobacco: Never  Vaping Use  Vaping status: Never Used  Substance and Sexual Activity   Alcohol use: Not Currently   Drug use: Yes    Frequency: 7.0 times per week    Types: Marijuana    Comment: daily use    Sexual activity: Not Currently    Birth control/protection: Pill  Other Topics Concern   Not on file  Social History Narrative   Not on file   Social Drivers of Health   Financial Resource Strain: Not on file  Food Insecurity: Not on file  Transportation Needs: Not on file  Physical Activity:  Not on file  Stress: Not on file  Social Connections: Not on file  Intimate Partner Violence: Not on file     Phebe Brasil, M.D. Ph.D.  [facility] [address], [address] [place], [place] [address] Ph: [phone] Fax: [phone]  CC:  Phebe Brasil, MD [address] [place],  [place] [address]  Phebe Brasil, MD

## 2024-03-11 ENCOUNTER — Encounter: Payer: Self-pay | Admitting: Neurology

## 2024-03-11 LAB — CBC WITH DIFFERENTIAL/PLATELET
Basophils Absolute: 0.1 10*3/uL (ref 0.0–0.2)
Basos: 1 %
EOS (ABSOLUTE): 0.2 10*3/uL (ref 0.0–0.4)
Eos: 2 %
Hematocrit: 40.2 % (ref 34.0–46.6)
Hemoglobin: 13.7 g/dL (ref 11.1–15.9)
Immature Grans (Abs): 0 10*3/uL (ref 0.0–0.1)
Immature Granulocytes: 0 %
Lymphocytes Absolute: 1.8 10*3/uL (ref 0.7–3.1)
Lymphs: 28 %
MCH: 30.2 pg (ref 26.6–33.0)
MCHC: 34.1 g/dL (ref 31.5–35.7)
MCV: 89 fL (ref 79–97)
Monocytes Absolute: 0.6 10*3/uL (ref 0.1–0.9)
Monocytes: 10 %
Neutrophils Absolute: 3.7 10*3/uL (ref 1.4–7.0)
Neutrophils: 59 %
Platelets: 213 10*3/uL (ref 150–450)
RBC: 4.54 x10E6/uL (ref 3.77–5.28)
RDW: 13.1 % (ref 11.7–15.4)
WBC: 6.3 10*3/uL (ref 3.4–10.8)

## 2024-03-11 LAB — COMPREHENSIVE METABOLIC PANEL WITH GFR
ALT: 14 IU/L (ref 0–32)
AST: 19 IU/L (ref 0–40)
Albumin: 4.2 g/dL (ref 3.9–4.9)
Alkaline Phosphatase: 45 IU/L (ref 44–121)
BUN/Creatinine Ratio: 30 — ABNORMAL HIGH (ref 9–23)
BUN: 15 mg/dL (ref 6–20)
Bilirubin Total: 0.3 mg/dL (ref 0.0–1.2)
CO2: 22 mmol/L (ref 20–29)
Calcium: 8.9 mg/dL (ref 8.7–10.2)
Chloride: 103 mmol/L (ref 96–106)
Creatinine, Ser: 0.5 mg/dL — ABNORMAL LOW (ref 0.57–1.00)
Globulin, Total: 2.2 g/dL (ref 1.5–4.5)
Glucose: 79 mg/dL (ref 70–99)
Potassium: 4.2 mmol/L (ref 3.5–5.2)
Sodium: 138 mmol/L (ref 134–144)
Total Protein: 6.4 g/dL (ref 6.0–8.5)
eGFR: 129 mL/min/{1.73_m2} (ref >59)

## 2024-03-11 LAB — CK: Total CK: 54 U/L (ref 32–182)

## 2024-03-11 LAB — TSH: TSH: 0.6 u[IU]/mL (ref 0.450–4.500)

## 2024-03-27 DIAGNOSIS — G1111 Friedreich ataxia: Secondary | ICD-10-CM | POA: Diagnosis not present

## 2024-03-27 DIAGNOSIS — M4100 Infantile idiopathic scoliosis, site unspecified: Secondary | ICD-10-CM | POA: Diagnosis not present

## 2024-04-17 DIAGNOSIS — M4100 Infantile idiopathic scoliosis, site unspecified: Secondary | ICD-10-CM | POA: Diagnosis not present

## 2024-04-17 DIAGNOSIS — G1111 Friedreich ataxia: Secondary | ICD-10-CM | POA: Diagnosis not present

## 2024-08-27 ENCOUNTER — Telehealth (HOSPITAL_BASED_OUTPATIENT_CLINIC_OR_DEPARTMENT_OTHER): Payer: Self-pay | Admitting: Cardiology

## 2024-08-27 DIAGNOSIS — I422 Other hypertrophic cardiomyopathy: Secondary | ICD-10-CM

## 2024-08-27 NOTE — Telephone Encounter (Signed)
  Per MyChart scheduling message:  Patient is asking if she can have an echo done before she sees Dr Lonni in January. Please advise.

## 2024-08-27 NOTE — Telephone Encounter (Signed)
 Has been ordered  Left message to call back

## 2024-08-28 NOTE — Telephone Encounter (Signed)
 Test ordered and will have scheduler call to schedule.

## 2024-10-03 ENCOUNTER — Other Ambulatory Visit (HOSPITAL_BASED_OUTPATIENT_CLINIC_OR_DEPARTMENT_OTHER)

## 2024-10-03 DIAGNOSIS — I422 Other hypertrophic cardiomyopathy: Secondary | ICD-10-CM | POA: Diagnosis not present

## 2024-10-03 LAB — ECHOCARDIOGRAM COMPLETE
AR max vel: 2.34 cm2
AV Area VTI: 2.48 cm2
AV Area mean vel: 2.43 cm2
AV Mean grad: 2 mmHg
AV Peak grad: 3.8 mmHg
Ao pk vel: 0.98 m/s
Area-P 1/2: 5.42 cm2
Est EF: >75
S' Lateral: 2 cm

## 2024-10-07 ENCOUNTER — Other Ambulatory Visit: Payer: Self-pay

## 2024-10-07 MED ORDER — SKYCLARYS 50 MG PO CAPS: 150.0000 mg | ORAL_CAPSULE | Freq: Every day | ORAL | 3 refills | Status: AC

## 2024-10-21 ENCOUNTER — Ambulatory Visit (HOSPITAL_BASED_OUTPATIENT_CLINIC_OR_DEPARTMENT_OTHER): Payer: Self-pay | Admitting: Cardiology

## 2024-10-27 ENCOUNTER — Telehealth: Payer: Self-pay

## 2024-10-27 ENCOUNTER — Other Ambulatory Visit (HOSPITAL_COMMUNITY): Payer: Self-pay

## 2024-10-27 NOTE — Telephone Encounter (Signed)
 Meena From [facility] called  to  follow up about  Pt clinical notes being faxed over to there off to get Pt PA approved   Phone # [phone] Fax: [phone]

## 2024-10-27 NOTE — Telephone Encounter (Signed)
 Pharmacy Patient Advocate Encounter  Received notification from [employer] that Prior Authorization for Skyclarys  has been APPROVED from 11/13/2024 to 11/12/2025   PA #/Case ID/Reference #: [id number]

## 2024-10-27 NOTE — Telephone Encounter (Signed)
 Pharmacy Patient Advocate Encounter   Received notification from Patient Pharmacy that prior authorization for Skyclarys  is required/requested.   Insurance verification completed.   The patient is insured through [facility].   Per test claim: PA required; PA submitted to above mentioned insurance via Latent Key/confirmation #/EOC [id number] Status is pending

## 2024-10-28 NOTE — Telephone Encounter (Signed)
 Called [facility] back, spoke with [employer]. She took note to send to Skyclarys  team that PA was approved from 11/13/24 - 11/12/25 and I provided her with the PA approval ID #.

## 2024-11-17 ENCOUNTER — Encounter (HOSPITAL_BASED_OUTPATIENT_CLINIC_OR_DEPARTMENT_OTHER): Payer: Self-pay

## 2024-11-18 ENCOUNTER — Ambulatory Visit (HOSPITAL_BASED_OUTPATIENT_CLINIC_OR_DEPARTMENT_OTHER): Admitting: Cardiology

## 2024-11-18 ENCOUNTER — Encounter (HOSPITAL_BASED_OUTPATIENT_CLINIC_OR_DEPARTMENT_OTHER): Payer: Self-pay | Admitting: Cardiology

## 2024-11-18 VITALS — BP 104/60 | HR 79 | Ht 69.0 in | Wt 150.0 lb

## 2024-11-18 DIAGNOSIS — I517 Cardiomegaly: Secondary | ICD-10-CM | POA: Diagnosis not present

## 2024-11-18 DIAGNOSIS — I422 Other hypertrophic cardiomyopathy: Secondary | ICD-10-CM | POA: Diagnosis not present

## 2024-11-18 DIAGNOSIS — G1111 Friedreich ataxia: Secondary | ICD-10-CM

## 2024-11-18 DIAGNOSIS — Z8249 Family history of ischemic heart disease and other diseases of the circulatory system: Secondary | ICD-10-CM | POA: Diagnosis not present

## 2024-11-18 DIAGNOSIS — I43 Cardiomyopathy in diseases classified elsewhere: Secondary | ICD-10-CM | POA: Diagnosis not present

## 2024-11-18 NOTE — Progress Notes (Signed)
 " Cardiology Office Note:  .    Date:  11/18/2024  ID:  Robin Glenn, DOB 1991-03-13, MRN [id number] PCP: Robin Duos, MD  [facility] Providers Cardiologist:  Shelda Bruckner, MD     History of Present Illness: .    Robin Glenn is a 34 y.o. 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 I initially met her 05/26/2022 as a new consult at the request of Robin Duos, MD for the evaluation and management of hypertrophy due to Friedrich's ataxia. Previously followed by Dr. Blanca.   Was on interferon-gamma therapy for a year, there is some evidence that this can be related to LVH. Was on this 2016-2017. Started omaveloxolone  (skyclarys ) 2023. Following annual echoes.  Today:  Here with her father and her boyfriend today. Able to get around a bit. Once she gets on the exercise bike she can go for about 30-60 minutes at a slow pace, breaks occasionally. Does need assistance with getting in/out of the shower, etc. Doing well on skyclarys . Asking about gene therapy clinical trials, discussed pros/cons of this at length. Reviewed her echo images together.  ROS:  Denies chest pain, shortness of breath at rest or with normal exertion. No PND, orthopnea, LE edema or unexpected weight gain. No syncope or palpitations. ROS otherwise negative except as noted.   Studies Reviewed: SABRA    EKG Interpretation Date/Time:  Tuesday November 18 2024 13:26:33 EST Ventricular Rate:  79 PR Interval:  158 QRS Duration:  78 QT Interval:  366 QTC Calculation: 419 R Axis:   69  Text Interpretation: Normal sinus rhythm Possible Left atrial enlargement Inferior infarct (cited on or before 18-Jul-2023) Confirmed by Bruckner Shelda [phone]) on 11/30/2024 2:51:34 PM    Physical Exam:    VS:  BP 104/60 (BP Location: Right Arm, Patient Position: Sitting, Cuff Size: Normal)   Pulse 79   Ht 5' 9 (1.753 m)   Wt 150 lb (68 kg)   SpO2 96%   BMI  22.15 kg/m    Wt Readings from Last 3 Encounters:  11/18/24 150 lb (68 kg)  03/10/24 150 lb (68 kg)  07/18/23 150 lb (68 kg)    GEN: Well nourished, well developed in no acute distress HEENT: Normal, moist mucous membranes NECK: No JVD CARDIAC: regular rhythm, normal S1 and S2, no rubs or gallops. 1/6 systolic murmur. VASCULAR: Radial and DP pulses 2+ bilaterally. No carotid bruits RESPIRATORY:  clear bilaterally ABDOMEN: Soft, non-tender, non-distended MUSCULOSKELETAL:  in wheelchair. SKIN: Warm and dry, no edema NEUROLOGIC:  Glenn and oriented x 3 PSYCHIATRIC:  Normal affect   ASSESSMENT AND PLAN: .    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 She has concentric LVH, more consistent with her family history of HCM, but cannot exclude involvement from FA. Her MRI in 2018 had more of the septal predominance noted -no documented arrhythmias, no syncope -we discussed in general the difference between gene therapies and medical clinical trials today   Cardiac risk counseling and prevention recommendations: -recommend heart healthy/Mediterranean diet, with whole grains, fruits, vegetable, fish, lean meats, nuts, and olive oil. Limit salt. -recommend moderate walking, 3-5 times/week for 30-50 minutes each session. Aim for at least 150 minutes.week. Goal should be pace of 3 miles/hours, or walking 1.5 miles in 30 minutes -  recommend avoidance of tobacco products. Avoid excess alcohol. -ASCVD risk score: The ASCVD Risk score (Arnett DK, et al., 2019) failed to calculate for the following reasons:   The 2019 ASCVD risk score is only valid for ages 61 to 75   * - Cholesterol units were assumed   Dispo: Follow-up in 1 year, or sooner as needed.  Signed, Shelda Bruckner, MD   "

## 2024-11-18 NOTE — Patient Instructions (Signed)
 Medication Instructions:  Your physician recommends that you continue on your current medications as directed. Please refer to the Current Medication list given to you today.  *If you need a refill on your cardiac medications before your next appointment, please call your pharmacy*  Lab Work: NONE  Testing/Procedures: Your physician has requested that you have an echocardiogram. Echocardiography is a painless test that uses sound waves to create images of your heart. It provides your doctor with information about the size and shape of your heart and how well your hearts chambers and valves are working. This procedure takes approximately one hour. There are no restrictions for this procedure. Please do NOT wear cologne, perfume, aftershave, or lotions (deodorant is allowed). Please arrive 15 minutes prior to your appointment time.  Please note: We ask at that you not bring children with you during ultrasound (echo/ vascular) testing. Due to room size and safety concerns, children are not allowed in the ultrasound rooms during exams. Our front office staff cannot provide observation of children in our lobby area while testing is being conducted. An adult accompanying a patient to their appointment will only be allowed in the ultrasound room at the discretion of the ultrasound technician under special circumstances. We apologize for any inconvenience.  TO BE DONE IN NOVEMBER   Follow-Up: At [facility], you and your health needs are our priority.  As part of our continuing mission to provide you with exceptional heart care, our providers are all part of one team.  This team includes your primary Cardiologist (physician) and Advanced Practice Providers or APPs (Physician Assistants and Nurse Practitioners) who all work together to provide you with the care you need, when you need it.  Your next appointment:   AFTER NOVEMBER ECHO EITHER WITH DR LONNI ROSALINE RAMAN NP, OR CAITLIN W NP    We recommend signing up for the patient portal called MyChart.  Sign up information is provided on this After Visit Summary.  MyChart is used to connect with patients for Virtual Visits (Telemedicine).  Patients are able to view lab/test results, encounter notes, upcoming appointments, etc.  Non-urgent messages can be sent to your provider as well.   To learn more about what you can do with MyChart, go to [email or web address].

## 2025-03-10 ENCOUNTER — Ambulatory Visit: Admitting: Family Medicine

## 2025-09-14 ENCOUNTER — Other Ambulatory Visit (HOSPITAL_BASED_OUTPATIENT_CLINIC_OR_DEPARTMENT_OTHER)
